# Patient Record
Sex: Female | Born: 1985 | Race: White | Hispanic: No | Marital: Married | Smoking: Never smoker
Health system: Southern US, Community
[De-identification: ages and names within clinical notes are randomized; demographics above are authoritative.]

## PROBLEM LIST (undated history)

## (undated) DIAGNOSIS — I2510 Atherosclerotic heart disease of native coronary artery without angina pectoris: Secondary | ICD-10-CM

## (undated) DIAGNOSIS — R51 Headache: Secondary | ICD-10-CM

## (undated) DIAGNOSIS — B977 Papillomavirus as the cause of diseases classified elsewhere: Secondary | ICD-10-CM

## (undated) DIAGNOSIS — T783XXA Angioneurotic edema, initial encounter: Secondary | ICD-10-CM

## (undated) DIAGNOSIS — R112 Nausea with vomiting, unspecified: Secondary | ICD-10-CM

## (undated) DIAGNOSIS — Z9889 Other specified postprocedural states: Secondary | ICD-10-CM

## (undated) DIAGNOSIS — R87629 Unspecified abnormal cytological findings in specimens from vagina: Secondary | ICD-10-CM

## (undated) DIAGNOSIS — L509 Urticaria, unspecified: Secondary | ICD-10-CM

## (undated) DIAGNOSIS — R519 Headache, unspecified: Secondary | ICD-10-CM

## (undated) DIAGNOSIS — I214 Non-ST elevation (NSTEMI) myocardial infarction: Secondary | ICD-10-CM

## (undated) HISTORY — PX: SHOULDER SURGERY: SHX246

## (undated) HISTORY — DX: Angioneurotic edema, initial encounter: T78.3XXA

## (undated) HISTORY — DX: Urticaria, unspecified: L50.9

## (undated) HISTORY — PX: OTHER SURGICAL HISTORY: SHX169

## (undated) HISTORY — PX: ADENOIDECTOMY: SUR15

## (undated) HISTORY — DX: Non-ST elevation (NSTEMI) myocardial infarction: I21.4

## (undated) HISTORY — DX: Atherosclerotic heart disease of native coronary artery without angina pectoris: I25.10

## (undated) HISTORY — PX: WISDOM TOOTH EXTRACTION: SHX21

## (undated) HISTORY — PX: TONSILLECTOMY: SUR1361

## (undated) HISTORY — PX: COLPOSCOPY: SHX161

---

## 2006-09-21 HISTORY — PX: SHOULDER SURGERY: SHX246

## 2007-03-03 ENCOUNTER — Ambulatory Visit (HOSPITAL_COMMUNITY): Admission: RE | Admit: 2007-03-03 | Discharge: 2007-03-03 | Payer: Self-pay | Admitting: Orthopaedic Surgery

## 2009-04-08 ENCOUNTER — Inpatient Hospital Stay (HOSPITAL_COMMUNITY): Admission: RE | Admit: 2009-04-08 | Discharge: 2009-04-12 | Payer: Self-pay | Admitting: Cardiology

## 2009-04-08 ENCOUNTER — Ambulatory Visit: Payer: Self-pay | Admitting: Cardiology

## 2009-04-09 ENCOUNTER — Encounter: Payer: Self-pay | Admitting: Cardiology

## 2009-04-10 ENCOUNTER — Ambulatory Visit: Payer: Self-pay | Admitting: Oncology

## 2009-04-10 ENCOUNTER — Encounter: Payer: Self-pay | Admitting: Cardiology

## 2009-04-11 ENCOUNTER — Ambulatory Visit: Payer: Self-pay | Admitting: Oncology

## 2009-04-16 ENCOUNTER — Encounter: Payer: Self-pay | Admitting: Cardiology

## 2009-04-16 DIAGNOSIS — I2510 Atherosclerotic heart disease of native coronary artery without angina pectoris: Secondary | ICD-10-CM | POA: Insufficient documentation

## 2009-04-19 ENCOUNTER — Ambulatory Visit: Payer: Self-pay | Admitting: Cardiology

## 2009-04-22 ENCOUNTER — Telehealth: Payer: Self-pay | Admitting: Cardiology

## 2009-04-25 ENCOUNTER — Ambulatory Visit: Payer: Self-pay

## 2009-04-25 ENCOUNTER — Ambulatory Visit: Payer: Self-pay | Admitting: Cardiology

## 2009-04-25 LAB — CONVERTED CEMR LAB
BUN: 18 mg/dL (ref 6–23)
CO2: 24 meq/L (ref 19–32)
Calcium: 9.9 mg/dL (ref 8.4–10.5)
Glucose, Bld: 96 mg/dL (ref 70–99)
Potassium: 5.2 meq/L — ABNORMAL HIGH (ref 3.5–5.1)
Sodium: 141 meq/L (ref 135–145)

## 2009-04-29 ENCOUNTER — Encounter: Payer: Self-pay | Admitting: Cardiology

## 2009-04-30 LAB — CONVERTED CEMR LAB
BUN: 14 mg/dL (ref 6–23)
CO2: 24 meq/L (ref 19–32)
Calcium: 9.3 mg/dL (ref 8.4–10.5)
Chloride: 107 meq/L (ref 96–112)
Creatinine, Ser: 0.74 mg/dL (ref 0.40–1.20)

## 2009-05-02 ENCOUNTER — Telehealth: Payer: Self-pay | Admitting: Cardiology

## 2009-05-09 ENCOUNTER — Encounter: Payer: Self-pay | Admitting: Cardiology

## 2009-05-09 ENCOUNTER — Telehealth: Payer: Self-pay | Admitting: Cardiology

## 2009-05-14 ENCOUNTER — Ambulatory Visit: Payer: Self-pay

## 2009-05-14 ENCOUNTER — Ambulatory Visit: Payer: Self-pay | Admitting: Oncology

## 2009-05-14 ENCOUNTER — Encounter: Payer: Self-pay | Admitting: Cardiology

## 2009-05-14 ENCOUNTER — Ambulatory Visit: Payer: Self-pay | Admitting: Cardiology

## 2009-05-14 DIAGNOSIS — E875 Hyperkalemia: Secondary | ICD-10-CM | POA: Insufficient documentation

## 2009-05-14 DIAGNOSIS — E782 Mixed hyperlipidemia: Secondary | ICD-10-CM | POA: Insufficient documentation

## 2009-05-16 ENCOUNTER — Encounter: Payer: Self-pay | Admitting: Cardiology

## 2009-05-16 ENCOUNTER — Other Ambulatory Visit: Admission: RE | Admit: 2009-05-16 | Discharge: 2009-05-16 | Payer: Self-pay | Admitting: Oncology

## 2009-05-16 LAB — CBC WITH DIFFERENTIAL/PLATELET
Basophils Absolute: 0 10*3/uL (ref 0.0–0.1)
Eosinophils Absolute: 0.1 10*3/uL (ref 0.0–0.5)
HCT: 34.2 % — ABNORMAL LOW (ref 34.8–46.6)
HGB: 11.5 g/dL — ABNORMAL LOW (ref 11.6–15.9)
LYMPH%: 37.4 % (ref 14.0–49.7)
MCV: 87.4 fL (ref 79.5–101.0)
MONO%: 10.1 % (ref 0.0–14.0)
NEUT#: 1.6 10*3/uL (ref 1.5–6.5)
NEUT%: 49.2 % (ref 38.4–76.8)
Platelets: 149 10*3/uL (ref 145–400)

## 2009-05-16 LAB — COMPREHENSIVE METABOLIC PANEL
ALT: 38 U/L — ABNORMAL HIGH (ref 0–35)
Albumin: 4.1 g/dL (ref 3.5–5.2)
CO2: 19 mEq/L (ref 19–32)
Calcium: 8.8 mg/dL (ref 8.4–10.5)
Chloride: 109 mEq/L (ref 96–112)
Creatinine, Ser: 0.57 mg/dL (ref 0.40–1.20)
Potassium: 3.7 mEq/L (ref 3.5–5.3)
Sodium: 141 mEq/L (ref 135–145)
Total Protein: 6.1 g/dL (ref 6.0–8.3)

## 2009-05-16 LAB — LIPID PANEL
Cholesterol: 69 mg/dL (ref 0–200)
LDL Cholesterol: 24 mg/dL (ref 0–99)
Triglycerides: 55 mg/dL (ref <150)

## 2009-05-21 LAB — LUPUS ANTICOAGULANT PANEL: DRVVT: 43.3 secs (ref 36.1–47.0)

## 2009-05-21 LAB — PROTEIN C, TOTAL: Protein C, Total: 78 % (ref 70–140)

## 2009-05-21 LAB — BETA-2 GLYCOPROTEIN ANTIBODIES
Beta-2 Glyco I IgG: <20 U/mL (ref <20)
Beta-2-Glycoprotein I IgM: <10 U/mL (ref <10)

## 2009-05-21 LAB — PROTEIN C ACTIVITY: Protein C Activity: 116 % (ref 75–133)

## 2009-05-21 LAB — PROTEIN S, TOTAL: Protein S Ag, Total: 72 % (ref 70–140)

## 2009-05-21 LAB — PROTEIN S, ANTIGEN, FREE: Protein S Ag, Free: 70 % normal (ref 50–147)

## 2009-05-22 ENCOUNTER — Encounter: Payer: Self-pay | Admitting: Cardiology

## 2009-05-22 ENCOUNTER — Encounter (HOSPITAL_COMMUNITY): Admission: RE | Admit: 2009-05-22 | Discharge: 2009-06-20 | Payer: Self-pay | Admitting: Cardiology

## 2009-06-20 ENCOUNTER — Encounter: Payer: Self-pay | Admitting: Cardiology

## 2009-06-21 ENCOUNTER — Encounter (HOSPITAL_COMMUNITY): Admission: RE | Admit: 2009-06-21 | Discharge: 2009-07-21 | Payer: Self-pay | Admitting: Orthopedic Surgery

## 2009-06-26 ENCOUNTER — Encounter (INDEPENDENT_AMBULATORY_CARE_PROVIDER_SITE_OTHER): Payer: Self-pay | Admitting: *Deleted

## 2009-06-26 ENCOUNTER — Telehealth: Payer: Self-pay | Admitting: Cardiology

## 2009-06-27 ENCOUNTER — Ambulatory Visit: Payer: Self-pay | Admitting: Cardiology

## 2009-06-28 ENCOUNTER — Encounter: Payer: Self-pay | Admitting: Cardiology

## 2009-07-03 LAB — CONVERTED CEMR LAB
ALT: 46 units/L — ABNORMAL HIGH (ref 0–35)
AST: 31 units/L (ref 0–37)
Alkaline Phosphatase: 57 units/L (ref 39–117)
Basophils Absolute: 0 10*3/uL (ref 0.0–0.1)
Basophils Relative: 0 % (ref 0–1)
Bilirubin, Direct: 0.1 mg/dL (ref 0.0–0.3)
Cholesterol: 82 mg/dL (ref 0–200)
Indirect Bilirubin: 0.4 mg/dL (ref 0.0–0.9)
MCHC: 30.6 g/dL (ref 30.0–36.0)
Neutro Abs: 2.3 10*3/uL (ref 1.7–7.7)
Neutrophils Relative %: 54 % (ref 43–77)
RDW: 14.6 % (ref 11.5–15.5)

## 2009-07-22 ENCOUNTER — Encounter (HOSPITAL_COMMUNITY): Admission: RE | Admit: 2009-07-22 | Discharge: 2009-08-21 | Payer: Self-pay | Admitting: Cardiology

## 2009-07-22 ENCOUNTER — Telehealth: Payer: Self-pay | Admitting: Cardiology

## 2009-08-14 ENCOUNTER — Encounter: Payer: Self-pay | Admitting: Cardiology

## 2009-08-20 LAB — CONVERTED CEMR LAB
Bilirubin, Direct: 0.1 mg/dL (ref 0.0–0.3)
Indirect Bilirubin: 0.4 mg/dL (ref 0.0–0.9)
LDL Cholesterol: 34 mg/dL (ref 0–99)
Total Protein: 6.7 g/dL (ref 6.0–8.3)
Triglycerides: 51 mg/dL (ref <150)
VLDL: 10 mg/dL (ref 0–40)

## 2009-08-21 ENCOUNTER — Encounter (HOSPITAL_COMMUNITY): Admission: RE | Admit: 2009-08-21 | Discharge: 2009-09-18 | Payer: Self-pay | Admitting: Orthopedic Surgery

## 2009-09-12 ENCOUNTER — Ambulatory Visit: Payer: Self-pay | Admitting: Oncology

## 2009-09-17 ENCOUNTER — Encounter: Payer: Self-pay | Admitting: Cardiology

## 2009-10-04 ENCOUNTER — Emergency Department (HOSPITAL_COMMUNITY): Admission: EM | Admit: 2009-10-04 | Discharge: 2009-10-04 | Payer: Self-pay | Admitting: Emergency Medicine

## 2009-10-07 ENCOUNTER — Ambulatory Visit: Payer: Self-pay | Admitting: Cardiology

## 2009-10-09 LAB — CONVERTED CEMR LAB
AST: 18 units/L (ref 0–37)
Albumin: 3.5 g/dL (ref 3.5–5.2)
Alkaline Phosphatase: 48 units/L (ref 39–117)
Cholesterol: 101 mg/dL (ref 0–200)
LDL Cholesterol: 54 mg/dL (ref 0–99)
Total Protein: 6.8 g/dL (ref 6.0–8.3)
Triglycerides: 83 mg/dL (ref 0.0–149.0)

## 2009-10-23 ENCOUNTER — Encounter: Payer: Self-pay | Admitting: Cardiology

## 2009-11-12 ENCOUNTER — Encounter: Payer: Self-pay | Admitting: Cardiology

## 2009-11-13 ENCOUNTER — Encounter: Payer: Self-pay | Admitting: Cardiology

## 2009-11-13 ENCOUNTER — Telehealth: Payer: Self-pay | Admitting: Cardiology

## 2009-11-14 ENCOUNTER — Ambulatory Visit: Payer: Self-pay | Admitting: Cardiology

## 2010-01-15 ENCOUNTER — Ambulatory Visit: Payer: Self-pay | Admitting: Cardiology

## 2010-01-31 ENCOUNTER — Encounter: Payer: Self-pay | Admitting: Cardiology

## 2010-03-17 ENCOUNTER — Ambulatory Visit: Payer: Self-pay | Admitting: Oncology

## 2010-03-28 ENCOUNTER — Encounter: Payer: Self-pay | Admitting: Cardiology

## 2010-08-13 ENCOUNTER — Ambulatory Visit: Payer: Self-pay | Admitting: Cardiology

## 2010-08-25 ENCOUNTER — Telehealth: Payer: Self-pay | Admitting: Cardiology

## 2010-08-25 LAB — CONVERTED CEMR LAB
Cholesterol: 104 mg/dL (ref 0–200)
LDL Cholesterol: 34 mg/dL (ref 0–99)
TSH: 2.875 microintl units/mL (ref 0.350–4.500)
VLDL: 11 mg/dL (ref 0–40)

## 2010-09-09 ENCOUNTER — Ambulatory Visit: Payer: Self-pay

## 2010-09-09 ENCOUNTER — Ambulatory Visit: Payer: Self-pay | Admitting: Cardiology

## 2010-10-12 ENCOUNTER — Encounter: Payer: Self-pay | Admitting: Orthopaedic Surgery

## 2010-10-22 NOTE — Assessment & Plan Note (Signed)
Summary: fluttering in chest   Visit Type:  Follow-up Primary Provider:  Wyvonnia Lora MD  CC:  heart flutter- chest discomfort for the past few days.  History of Present Illness: Noticed some skip beats, essentially when sitting down.  Worked out today and walked on treadmill without symptoms.  Has chronic shoulder issues since surgery.  Denies chest pain.  Denies syncope.  Repeat CRP pending.  Heading to [facility].    Current Medications (verified): 1)  Aspirin 81 Mg Tbec (Aspirin) .... Take One Tablet By Mouth Daily 2)  Nitroglycerin 0.4 Mg Subl (Nitroglycerin) .... One Tablet Under Tongue Every 5 Minutes As Needed For Chest Pain---May Repeat Times Three 3)  Simvastatin 20 Mg Tabs (Simvastatin) .... Take One-Fourth  Tablet By Mouth Daily At Bedtime  Allergies (verified): No Known Drug Allergies  Vital Signs:  Patient profile:   25 year old female Height:      66 inches Weight:      150.75 pounds BMI:     24.42 Pulse rate:   62 / minute Pulse rhythm:   regular Resp:     18 per minute BP sitting:   100 / 64  (left arm) Cuff size:   regular  Vitals Entered By: Vikki Ports (November 14, 2009 11:01 AM)  Physical Exam  General:  Well developed, well nourished, in no acute distress. Lungs:  Clear bilaterally to auscultation and percussion. Heart:  Non-displaced PMI, chest non-tender; regular rate and rhythm, S1, S2 without murmurs, rubs or gallops. Carotid upstroke normal, no bruit. Extremities:  No clubbing or cyanosis. Neurologic:  Alert and oriented x 3.   EKG  Procedure date:  11/14/2009  Findings:      NSR.  SB.  Impression & Recommendations:  Problem # 1:  CAD (ICD-414.00) Symptoms do not sound ischemic.  Neg coag workup.  Will continue to follow closely.   Her updated medication list for this problem includes:    Aspirin 81 Mg Tbec (Aspirin) .Marland Kitchen... Take one tablet by mouth daily    Nitroglycerin 0.4 Mg Subl (Nitroglycerin) ..... One tablet under tongue every  5 minutes as needed for chest pain---may repeat times three  Orders: EKG w/ Interpretation (93000)  Problem # 2:  HYPERCHOLESTEROLEMIA  IIA (ICD-272.0) At target.  Repeat CRP pending. Her updated medication list for this problem includes:    Simvastatin 20 Mg Tabs (Simvastatin) .Marland Kitchen... Take one-fourth  tablet by mouth daily at bedtime  Orders: EKG w/ Interpretation (93000)  Patient Instructions: 1)  Your physician recommends that you schedule a follow-up appointment in: 2 MONTHS 2)  Your physician recommends that you continue on your current medications as directed. Please refer to the Current Medication list given to you today.  Appended Document: fluttering in chest Palpitations likely secondary to simple PVCS.  See rhythm strip.

## 2010-10-22 NOTE — Letter (Signed)
[facility]   Imported By: Roderic Ovens 10/14/2009 10:48:05  _____________________________________________________________________  External Attachment:    Type:   Image     Comment:   External Document  Appended Document: [facility]  Reviewed in detail.  We will repeat her hsCRP level which was obtained during an infection process.  Then rediscuss.  TS

## 2010-10-22 NOTE — Assessment & Plan Note (Signed)
Summary: 2 month f/u ov   Visit Type:  Follow-up Primary Provider:  Wyvonnia Lora MD   History of Present Illness: Doing well.  SHe had fluttering, and none since that time.  Denies any chest pain, and feels good overall.  Denies further problems.  Went to [facility] without difficulty.     Current Medications (verified): 1)  Aspirin 81 Mg Tbec (Aspirin) .... Take One Tablet By Mouth Daily 2)  Nitroglycerin 0.4 Mg Subl (Nitroglycerin) .... One Tablet Under Tongue Every 5 Minutes As Needed For Chest Pain---May Repeat Times Three 3)  Simvastatin 20 Mg Tabs (Simvastatin) .... Take One-Fourth  Tablet By Mouth Daily At Bedtime  Allergies (verified): No Known Drug Allergies  Past History:  Past Medical History: Last updated: 04/16/2009 Current Problems:  CAD (ICD-414.00)- percutaneous coronary intervention. bare-metal stent placed in the mid  circumflex. PRIMARY HYPERCOAGULABLE STATE (ICD-289.81)  Vital Signs:  Patient profile:   25 year old female Height:      66 inches Weight:      155 pounds BMI:     25.11 Pulse rate:   71 / minute BP sitting:   114 / 68  (left arm) Cuff size:   regular  Vitals Entered By: Burnett Kanaris, CNA (January 15, 2010 10:19 AM)  Physical Exam  General:  Well developed, well nourished, in no acute distress. Head:  normocephalic and atraumatic Eyes:  PERRLA/EOM intact; conjunctiva and lids normal. Lungs:  Clear bilaterally to auscultation and percussion. Heart:  Non-displaced PMI, chest non-tender; regular rate and rhythm, S1, S2 without murmurs, rubs or gallops. Carotid upstroke normal, no bruit.  Extremities:  No clubbing or cyanosis.  No edema.   EKG  Procedure date:  01/15/2010  Findings:      NSR.  WNL.     Impression & Recommendations:  Problem # 1:  CAD (ICD-414.00) No new symptoms.  Doing well. Denies chest pain.   Her updated medication list for this problem includes:    Aspirin 81 Mg Tbec (Aspirin) .Marland Kitchen... Take one tablet by mouth  daily    Nitroglycerin 0.4 Mg Subl (Nitroglycerin) ..... One tablet under tongue every 5 minutes as needed for chest pain---may repeat times three  Orders: EKG w/ Interpretation (93000)  Problem # 2:  HYPERCHOLESTEROLEMIA  IIA (ICD-272.0) At  target on low dose therpay.  See trial of CRPs associated with infection.  Now back to 0.9.  Would favor continue medical treatment.   Her updated medication list for this problem includes:    Simvastatin 20 Mg Tabs (Simvastatin) .Marland Kitchen... Take one-fourth  tablet by mouth daily at bedtime  Patient Instructions: 1)  Your physician recommends that you continue on your current medications as directed. Please refer to the Current Medication list given to you today. 2)  Your physician wants you to follow-up in:   6 MONTHS. You will receive a reminder letter in the mail two months in advance. If you don't receive a letter, please call our office to schedule the follow-up appointment.

## 2010-10-22 NOTE — Assessment & Plan Note (Signed)
Summary: f47m   Visit Type:  Follow-up Primary Provider:  Wyvonnia Lora MD   History of Present Illness: Overall doing well. Back to work.  No current symptoms.  Recent lipid profile reviewed.  Denies any chest pain.  BP after exercise is 80systolic.   Current Medications (verified): 1)  Aspirin 81 Mg Tbec (Aspirin) .... Take One Tablet By Mouth Daily 2)  Nitroglycerin 0.4 Mg Subl (Nitroglycerin) .... One Tablet Under Tongue Every 5 Minutes As Needed For Chest Pain---May Repeat Times Three 3)  Metoprolol Succinate 25 Mg Xr24h-Tab (Metoprolol Succinate) .... Take 1/2 Tablet Daily 4)  Simvastatin 20 Mg Tabs (Simvastatin) .... Take One-Fourth  Tablet By Mouth Daily At Bedtime 5)  Azithromycin 250 Mg Tabs (Azithromycin) .... Once Daily  Allergies (verified): No Known Drug Allergies  Past History:  Past Medical History: Last updated: 04/16/2009 Current Problems:  CAD (ICD-414.00)- percutaneous coronary intervention. bare-metal stent placed in the mid  circumflex. PRIMARY HYPERCOAGULABLE STATE (ICD-289.81)  Vital Signs:  Patient profile:   25 year old female Height:      66 inches Weight:      154 pounds BMI:     24.95 Pulse rate:   69 / minute BP sitting:   90 / 54  (left arm)  Vitals Entered By: Laurance Flatten CMA (October 07, 2009 10:52 AM)  Physical Exam  General:  Well developed, well nourished, in no acute distress. Lungs:  Clear bilaterally to auscultation and percussion. Heart:  Non-displaced PMI, chest non-tender; regular rate and rhythm, S1, S2 without murmurs, rubs or gallops. Carotid upstroke normal, no bruit. Normal abdominal aortic size, no bruits. Femorals normal pulses, no bruits. Pedals normal pulses. No edema, no varicosities.   EKG  Procedure date:  10/07/2009  Findings:      NSR.  WNL.    Impression & Recommendations:  Problem # 1:  CAD (ICD-414.00) BP remains low.  Will d/c metoprolol.  Continue ASA. The following medications were removed from the  medication list:    Plavix 75 Mg Tabs (Clopidogrel bisulfate) .Marland Kitchen... Take one tablet by mouth daily    Metoprolol Succinate 25 Mg Xr24h-tab (Metoprolol succinate) .Marland Kitchen... Take 1/2 tablet daily Her updated medication list for this problem includes:    Aspirin 81 Mg Tbec (Aspirin) .Marland Kitchen... Take one tablet by mouth daily    Nitroglycerin 0.4 Mg Subl (Nitroglycerin) ..... One tablet under tongue every 5 minutes as needed for chest pain---may repeat times three  Orders: EKG w/ Interpretation (93000) T-Homocysteine (66440-34742) T-Lipoprotein (a) (59563-87564) TLB-Lipid Panel (80061-LIPID) TLB-Hepatic/Liver Function Pnl (80076-HEPATIC) TLB-CRP-High Sensitivity (C-Reactive Protein) (86140-FCRP)  Problem # 2:  HYPERCHOLESTEROLEMIA  IIA (ICD-272.0)  LDL 30mg /dl.  Now on low dose.  Will do hsCRP, homocysteine, and LPa. Her updated medication list for this problem includes:    Simvastatin 20 Mg Tabs (Simvastatin) .Marland Kitchen... Take one-fourth  tablet by mouth daily at bedtime  Orders: T-Homocysteine (33295-18841) T-Lipoprotein (a) (66063-01601) TLB-Lipid Panel (80061-LIPID) TLB-Hepatic/Liver Function Pnl (80076-HEPATIC) TLB-CRP-High Sensitivity (C-Reactive Protein) (86140-FCRP)  Patient Instructions: 1)  Your physician recommends that you schedule a follow-up appointment in: 4 MONTHS 2)  Your physician recommends that you return for a FASTING LIPID, LIVER, Homocysteine, CRP-HS and LPa   3)  Your physician has recommended you make the following change in your medication: STOP Metoprolol

## 2010-10-22 NOTE — Progress Notes (Signed)
Summary: re lab work  Phone Note Call from Patient   Caller: Patient [phone] Reason for Call: Talk to Nurse Summary of Call: re lab work Initial call taken by: Glynda Jaeger,  August 25, 2010 11:58 AM  Follow-up for Phone Call        Phone Call Completed PT AWARE OF LAB RESULTS. Follow-up by: Scherrie Bateman, LPN,  August 25, 2010 12:02 PM

## 2010-10-22 NOTE — Letter (Signed)
[facility]   Imported By: Roderic Ovens 04/15/2010 11:27:19  _____________________________________________________________________  External Attachment:    Type:   Image     Comment:   External Document

## 2010-10-22 NOTE — Progress Notes (Signed)
Summary: arm pit pain/chest flutter  Phone Note Call from Patient Call back at [phone]   Caller: Mom/Amy Reason for Call: Talk to Nurse Summary of Call: had pain in arm pit last night, fluttering in chest area Initial call taken by: Migdalia Dk,  November 13, 2009 8:03 AM  Follow-up for Phone Call        I spoke with the pt and last night she had pain in her left armpit and felt fluttering in her chest.  The pt denies chest pain and these symptoms are not similar to what she felt with her MI. The pt had a physician at [facility] listen to her heart and they said her heart sounded normal, BP 103/64.  The pt denies consuming caffiene. The pt feels fine today.  The pt is going to [facility] next week and would like to see Dr Riley Kill.  I arranged an appt for the pt to see Dr Riley Kill on 11/14/09.  Follow-up by: Julieta Gutting, RN, BSN,  November 13, 2009 8:26 AM

## 2010-10-22 NOTE — Letter (Signed)
[facility]   Imported By: Roderic Ovens 03/19/2010 10:17:09  _____________________________________________________________________  External Attachment:    Type:   Image     Comment:   External Document

## 2010-10-22 NOTE — Assessment & Plan Note (Signed)
Summary: f75m   Visit Type:  Follow-up Primary Provider:  Wyvonnia Lora MD   History of Present Illness: patient having no cardiology complaints today.  She has had some head congestion, but nothing major.  She is riding her horse alot at present.  Current Medications (verified): 1)  Aspirin 81 Mg Tbec (Aspirin) .... Take One Tablet By Mouth Daily 2)  Simvastatin 10 Mg Tabs (Simvastatin) .... Take 1/2 Tab Daily  Allergies (verified): No Known Drug Allergies  Comments:  Nurse/Medical Assistant: patient stated meds the only 2 she is on is asa 81 mg and simvastatin 5 mg  she had 20 mg of simvastatin and cutting them in to 1/4 now she would like 10 mg tabs so she can cut them in half  Vital Signs:  Patient profile:   25 year old female Weight:      155 pounds BMI:     25.11 Pulse rate:   64 / minute BP sitting:   114 / 80  (right arm)  Vitals Entered By: Dreama Saa, CNA (August 13, 2010 10:08 AM)  Physical Exam  General:  Well developed, well nourished, in no acute distress. Head:  normocephalic and atraumatic Eyes:  PERRLA/EOM intact; conjunctiva and lids normal. Lungs:  Clear bilaterally to auscultation and percussion. Heart:  PMI non displaced.  Normal S1 and S2.  No murmur, rub, or gallop.  Pulses:  pulses normal in all 4 extremities Extremities:  No clubbing or cyanosis. Neurologic:  Alert and oriented x 3.   EKG  Procedure date:  08/13/2010  Findings:      NSR.  Possible LAE.  Otherwise unremarkable tracing.   Impression & Recommendations:  Problem # 1:  CAD (ICD-414.00) Perfectly stable at present.  Given age, will do exercise GXT for prescription, and status of PCI.  Continue ASA at present.  The following medications were removed from the medication list:    Nitroglycerin 0.4 Mg Subl (Nitroglycerin) ..... One tablet under tongue every 5 minutes as needed for chest pain---may repeat times three Her updated medication list for this problem includes:   Aspirin 81 Mg Tbec (Aspirin) .Marland Kitchen... Take one tablet by mouth daily  Orders: Treadmill (Treadmill) T- * Misc. Laboratory test [phone])  Problem # 2:  HYPERCHOLESTEROLEMIA  IIA (ICD-272.0) Needs lipid and liver profile.  Will check.  On very low dose treatment.   Her updated medication list for this problem includes:    Simvastatin 10 Mg Tabs (Simvastatin) .Marland Kitchen... Take one tablet by mouth once daily  Orders: T- * Misc. Laboratory test [phone])  Patient Instructions: 1)  Your physician has recommended you make the following change in your medication: SIMVASTATIN 10MG  TAKE 1/2 TABLET DAILY 2)  Your physician has requested that you have an exercise tolerance test.  For further information please visit [email or web address]  Please also follow instruction sheet, as given. Prescriptions: SIMVASTATIN 10 MG TABS (SIMVASTATIN) take one tablet by mouth once daily  #90 x 4   Entered by:   Deliah Goody, RN   Authorized by:   Ronaldo Miyamoto, MD, [facility]   Signed by:   Deliah Goody, RN on 08/13/2010   Method used:   Print then Give to Patient   RxID:   [id number]

## 2010-10-22 NOTE — Progress Notes (Signed)
Summary: Fluttering in chest  Phone Note Call from Patient Call back at [phone]   Caller: Patient Reason for Call: Talk to Nurse Summary of Call: REQ TO SPEAK TO NURSE Initial call taken by: Migdalia Dk,  November 13, 2009 4:25 PM  Follow-up for Phone Call        I spoke with the pt and she is currently having the fluttering in her chest.  I called the [place] office and they will do an EKG and rhythm strip on this pt at this time.  They will fax results to our office for appt tomorrow. Follow-up by: Julieta Gutting, RN, BSN,  November 13, 2009 4:46 PM  Additional Follow-up for Phone Call Additional follow up Details #1::        EKG and strips are NSR.  Pt aware.  Pt will see Dr Riley Kill tomorrow. Julieta Gutting, RN, BSN  November 13, 2009 5:48 PM

## 2010-12-28 LAB — BASIC METABOLIC PANEL
BUN: 4 mg/dL — ABNORMAL LOW (ref 6–23)
BUN: 5 mg/dL — ABNORMAL LOW (ref 6–23)
Calcium: 8.6 mg/dL (ref 8.4–10.5)
Chloride: 110 mEq/L (ref 96–112)
Creatinine, Ser: 0.56 mg/dL (ref 0.4–1.2)
Creatinine, Ser: 0.64 mg/dL (ref 0.4–1.2)
GFR calc Af Amer: >60 mL/min (ref >60)
GFR calc non Af Amer: >60 mL/min (ref >60)
GFR calc non Af Amer: >60 mL/min (ref >60)
Glucose, Bld: 91 mg/dL (ref 70–99)
Sodium: 136 mEq/L (ref 135–145)

## 2010-12-28 LAB — CBC
HCT: 31.8 % — ABNORMAL LOW (ref 36.0–46.0)
Hemoglobin: 12.2 g/dL (ref 12.0–15.0)
Hemoglobin: 12.3 g/dL (ref 12.0–15.0)
MCHC: 34 g/dL (ref 30.0–36.0)
MCV: 88.7 fL (ref 78.0–100.0)
MCV: 88.7 fL (ref 78.0–100.0)
Platelets: 140 10*3/uL — ABNORMAL LOW (ref 150–400)
Platelets: 141 10*3/uL — ABNORMAL LOW (ref 150–400)
RBC: 4.05 MIL/uL (ref 3.87–5.11)
RBC: 4.08 MIL/uL (ref 3.87–5.11)
RDW: 14.4 % (ref 11.5–15.5)
WBC: 5.9 10*3/uL (ref 4.0–10.5)
WBC: 6.1 10*3/uL (ref 4.0–10.5)
WBC: 7.5 10*3/uL (ref 4.0–10.5)
WBC: 8.1 10*3/uL (ref 4.0–10.5)

## 2010-12-28 LAB — POCT CARDIAC MARKERS
CKMB, poc: 3.7 ng/mL (ref 1.0–8.0)
Myoglobin, poc: 342 ng/mL (ref 12–200)
Myoglobin, poc: >500 ng/mL (ref 12–200)

## 2010-12-28 LAB — ANTIPHOSPHOLIPID SYNDROME EVAL, BLD
Anticardiolipin IgA: <10 [APL'U] — ABNORMAL LOW (ref <13)
Anticardiolipin IgM: <10 [MPL'U] — ABNORMAL LOW (ref <10)
Antiphosphatidylserine IgG: <11 GPS U/mL (ref <11.0)
Antiphosphatidylserine IgM: <25 MPS U/mL (ref <25.0)
PTT Lupus Anticoagulant: 43.1 secs (ref 36.3–48.8)

## 2010-12-28 LAB — CK TOTAL AND CKMB (NOT AT ARMC)
CK, MB: 405.4 ng/mL — ABNORMAL HIGH (ref 0.3–4.0)
CK, MB: 94.9 ng/mL — ABNORMAL HIGH (ref 0.3–4.0)
Total CK: 2999 U/L — ABNORMAL HIGH (ref 7–177)
Total CK: 3252 U/L — ABNORMAL HIGH (ref 7–177)

## 2010-12-28 LAB — TROPONIN I
Troponin I: 3.64 ng/mL (ref 0.00–0.06)
Troponin I: 81.6 ng/mL (ref 0.00–0.06)

## 2010-12-28 LAB — DIFFERENTIAL
Lymphocytes Relative: 10 % — ABNORMAL LOW (ref 12–46)
Monocytes Absolute: 0.3 10*3/uL (ref 0.1–1.0)
Monocytes Relative: 4 % (ref 3–12)
Neutro Abs: 6.5 10*3/uL (ref 1.7–7.7)

## 2010-12-28 LAB — LIPID PANEL
HDL: 45 mg/dL (ref >39)
LDL Cholesterol: 58 mg/dL (ref 0–99)
Total CHOL/HDL Ratio: 2.6 RATIO
Triglycerides: 68 mg/dL (ref <150)
VLDL: 14 mg/dL (ref 0–40)

## 2010-12-28 LAB — BETA-2-GLYCOPROTEIN I ABS, IGG/M/A: Beta-2 Glyco I IgG: <20 U/mL (ref <20)

## 2010-12-28 LAB — PROTIME-INR: INR: 1 (ref 0.00–1.49)

## 2010-12-28 LAB — PLATELET COUNT: Platelets: 137 10*3/uL — ABNORMAL LOW (ref 150–400)

## 2011-02-03 NOTE — Discharge Summary (Signed)
NAME:  Erin Morris, HINDS NO.:  [id number]   MEDICAL RECORD NO.:  [id number]          PATIENT TYPE:  INP   LOCATION:  2007                         FACILITY:  [facility]   PHYSICIAN:  Arturo Morton. Riley Kill, MD, FACCDATE OF BIRTH:  [birth year]   DATE OF ADMISSION:  04/08/2009  DATE OF DISCHARGE:  04/12/2009                               DISCHARGE SUMMARY   PRIMARY CARDIOLOGIST:  Maisie Fus D. Riley Kill, MD, [facility]   HEMO/ONCOLOGY:  Blenda Nicely. Shadad.   DISCHARGE DIAGNOSES:  1. Coronary artery disease, status post percutaneous coronary      intervention.  The patient had a bare-metal stent placed in the mid      circumflex, which was the cause of her myocardial infarction.  She      had normal left main, left anterior descending, and right coronary      artery, 2-D echocardiogram, ejection fraction 50%.  2. Potential hypercoagulable state.  The patient being evaluated by      Hemo/Oncology.  Hemo/Onc okay with the patient being discharged      home to follow up outpatient, highly recommending discontinuation      of oral contraceptive.   PAST MEDICAL HISTORY:  1. Tonsillectomy.  2. Shoulder surgery.   This is a 25 year old Caucasian female, who ruled in as an non-STEMI and  underwent urgent cardiac catheterization.  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  On day of admission, she was at the hospital  when she began to have acute symptoms of chest pain, shoulder pain,  nausea, and vomiting.  Her EKG showed nonspecific findings.  However,  her troponin initially was 3.64 and subsequently peaked to 81.6.  The  patient underwent cardiac catheterization on April 08, 2009, which showed  to have a total mid circumflex occlusion with normal EF and normal other  coronaries.  She received angioplasty and stent placement with a Liberte  bare-metal stent to recovers successfully without any complication, and  is currently tolerating aspirin and Plavix without problems.  The  patient denied any history of tobacco abuse or illicit substance use.  She is on oral contraceptive.   FAMILY HISTORY:  Viewed and noted to have a history of pulmonary emboli  in grandfather and the patient's father's twin brother had a DVT.   The patient will be worked up further as outpatient with Hemo/Oncology.  Post-cath intervention, cardiac rehabilitation was initiated.  The  patient has done quite well, tolerating very low dose beta-blocker  because of hypotension.  On day of discharge, Dr. Riley Kill in to see the  patient.  Blood pressure 99/53, heart rate is 91, and sat 96% on room  air.  Hemoglobin stable at 12.3.  Coag workup, results pending.  The  patient is scheduled to follow up with Dr. Riley Kill, July 30 at 3:45.  Dr. Alver Fisher office will call the patient with date and time.  The  patient has been given extensive instructions and written instructions  regarding her cardiac catheterization and myocardial infarction.  She is  limited to no driving for 2 weeks.  No sexual activity for  1 week.  She  may shower, no lifting for 1 week.  I will not clear her to return to  work for 3 weeks.  Heart-healthy diet is recommended.   Her prescriptions include:  1. Aspirin 325 daily.  2. Plavix 75 daily.  3. Pepcid 40 mg by mouth at bedtime.  4. Lisinopril 2.5 mg daily.  5. Metoprolol tartrate suspension should come in at 2.5 mg/1 mL      solution.  She is going to take 2.5 mL every 12 hours.  6. Nitroglycerin sublingual p.r.n. as needed.   DURATION OF DISCHARGE/ENCOUNTER:  Well over 30 minutes.      Dorian Pod, ACNP      Arturo Morton. Riley Kill, MD, [facility]  Electronically Signed    MB/MEDQ  D:  04/12/2009  T:  04/13/2009  Job:  [id number]   cc:   Blenda Nicely. [employer]

## 2011-02-03 NOTE — Consult Note (Signed)
NAME:  Erin Morris, Erin Morris NO.:  [id number]   MEDICAL RECORD NO.:  [id number]          PATIENT TYPE:  INP   LOCATION:  2007                         FACILITY:  [facility]   PHYSICIAN:  Blenda Nicely. Shadad        DATE OF BIRTH:  [birth year]   DATE OF CONSULTATION:  04/10/2009  DATE OF DISCHARGE:                                 CONSULTATION   REQUESTING PHYSICIAN:  Erin Morton. Riley Kill, MD, [facility]   REASON FOR CONSULTATION:  Acute myocardial infarction in the setting of  a potential hypercoagulable state.   HISTORY OF PRESENT ILLNESS:  A pleasant 25 year old Caucasian female, a  native of [place], but currently resides in [place], [place].  She currently lives independently with her boyfriend and currently works  as a Engineer, structural at [employer].  She does not have any  really significant comorbid conditions or previous hospitalizations  other than have had a history of shoulder dislocation and ligamentous  laxity and has required surgical interventions previously in 2008.  The  patient had been well until a few days ago on April 08, 2009, where she  had presented with acute symptoms of a chest pain, shoulder pain, and  nausea and vomiting and presented to [facility] Emergency Department.  At that time, her workup revealed a nonspecific finding on her EKG and  subsequently serial cardiac enzymes revealed elevation in troponin  initially at 3.64 and subsequently peaked at 81.6.  Given these  findings, I felt that she has a non-ST elevation acute myocardial  infarction and underwent a catheterization on April 08, 2009, and showed  to have a total mid circumflex occlusion and with normal EF and normal  other coronaries include the right coronary artery, LAD, and LMCA.  The  patient received angioplasty and stent placement and recovered  successfully without any complications and currently receiving both  aspirin and Plavix at this time.  Clinically, again as  mentioned,  feeling well without any residual chest pain or shortness of breath.  She has not reported any preceding symptoms, had not reported any  shortness of breath preceding that, did not report any recent  hospitalization or surgery  nor does she report any recent trauma or leg  swelling at any time.   REVIEW OF SYSTEMS:  Did not report any headaches, blurred vision, or  double vision.  She does not report any motor or sensory neuropathy, not  report any alteration of mental status.  Does not report any psychiatric  issues or depression.  Did not report any fevers, chills, or night  sweats.  Did not report any weight loss or appetite changes.  Did not  report any chest pain, orthopnea, or PND.  Did not report any cough,  hemoptysis, or hematemesis.  No nausea, vomiting, or abdominal pain.  No  hematochezia, melena, or genitourinary complaints.  Rest of review of  systems is unremarkable.   PAST MEDICAL HISTORY:  As mentioned, she has a history of joint laxity  in her shoulder, but did not really have any history of any joint  problems.  Did not report any recurrent bone fractures.  Did not report  any hypertension or diabetes.  Did not report any drug use or smoking.  Did report to use of oral contraceptives at the low-dose estrogen.   SOCIAL HISTORY:  She is single.  She lives with her boyfriend.  No  pregnancy.  She works as a Engineer, structural.  Again, denied use of cocaine  or any other drugs at this time.   FAMILY HISTORY:  Her father is alive at age 66 and no health issues, his  father had an MI and his father's mother had an aneurysm, also diagnosed  with a pulmonary embolus and found to have factor V Leiden.  Her uncle  which is her father's twin brother had a DVT and found to have factor V  Leiden.  Her mother is healthy and has a history of hypothyroid.  Her  aunt had multiple miscarriages.  She has 2 cousins who had had multiple  miscarriages as well.  She had 1 sister  and is healthy.   ALLERGIES:  None.   MEDICATIONS:  Aspirin, Plavix, Lovenox, Pepcid, lisinopril, and  metoprolol.   PHYSICAL EXAMINATION:  GENERAL:  Alert white female, not in any  distress.  VITAL SIGNS:  Within normal range with a blood pressure of 99/59, pulse  of 76, and respirations 18.  She was afebrile.  HEENT:  Head is normocephalic and atraumatic.  Pupils are equal, round,  and reactive to light.  Oral mucosa is moist and pink.  NECK:  Supple.  No lymphadenopathy.  HEART:  Regular rate and rhythm.  S1 and S2.  LUNGS:  Clear to auscultation.  ABDOMEN:  Soft and nontender.  No hepatosplenomegaly.  EXTREMITIES:  No edema.   LABORATORY DATA:  Homocysteine of 5.1.  Hemoglobin 11.4 and white cell  count 6.1.  Her lipid profile showed cholesterol of 117, LDL of 58, HDL  45.  Troponin peak at 81.   ASSESSMENT AND PLAN:  A 25 year old female presented with non-ST-  elevation acute myocardial infarction, found to have single-vessel  disease, status post percutaneous transluminal coronary angioplasty and  stent placement.  I had a lengthy discussion today with [name] and  her family discussing the differential diagnosis for acute MI in a young  individual.  1. Hypercoagulable state.  I think antiphospholipid antibody would be      the most common one given the fact that she had normal homocystine.      Again that is the condition that affects the arterial structure.      She does have a family history of early miscarriages and that may      be certainly something that worth investigation.  Factor V Leiden      mutation although runs in the family, it is usually venous in      etiology unless we are dealing with a paradoxical embolus and again      in the setting of oral contraceptives and positive factor V Leiden      and bubble study may be indicated to rule out a patent foramen      ovale.  2. Drug abuse in form of cocaine and spasm.  Again, it is less likely      given  the lack of history.  3. Structural damage within the arterials that has not been documented      in the catheterization.  Again, the condition like osteogenesis  imperfecta or Marfan syndrome may be possibility given her joint      laxity problem and makes it a also worth consideration.   RECOMMENDATION:  Recommendation at this point will be as follows:  1. I agree with a hypercoagulable workup.  I will add the      antiphospholipid panel and PTT.  2. I agree with stopping oral contraceptives.  3. I agree with aspirin and Plavix for the time being.  Although, she      has positive antiphospholipid antibody      syndrome and may be consideration of Coumadin.  4. If she is medically stable, discharge from the hospital.  It is      reasonable from my standpoint with a followup at the [facility].     ______________________________  Blenda Nicely. [facility]  Electronically Signed     Blenda Nicely. [facility]  Electronically Signed    FNS/MEDQ  D:  04/10/2009  T:  04/11/2009  Job:  [id number]

## 2011-02-03 NOTE — Cardiovascular Report (Signed)
NAME:  Erin Morris, Erin Morris NO.:  [id number]   MEDICAL RECORD NO.:  [id number]          PATIENT TYPE:  INP   LOCATION:  2007                         FACILITY:  [facility]   PHYSICIAN:  Arturo Morton. 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  She had chest discomfort, and  presented to the emergency room.  The electrocardiogram was nonspecific  and enzymes were borderline.  Repeat enzymes approximately 2 hours later  were significantly elevated, and then a repeat set was obtained shortly  thereafter.  They were abnormal as well.  The electrocardiogram at that  point was within normal limits.  Dr. Margretta Ditty spoke with Dr. Willa Rough, and she was transferred emergently to [facility] for  probable cath and possible intervention.  The patient arrived in the  holding area of the Cath Lab.  I reviewed the findings with the patient  and her mother in detail.  We spoke about specific mechanisms  potentially possible in the setting of myocardial infarction in a 2-  year-old, and this was reviewed in extensive detail.  Preparations were  made for urgent catheterization evaluation.  Risks, benefits, and  alternatives were discussed and the patient was agreeable to proceed.   PROCEDURE:  1. Left heart catheterization  2. Selective coronary arteriography.  3. Selective left ventriculography.  4. Aspiration thrombectomy.  5. Percutaneous stenting of the circumflex coronary artery.   DESCRIPTION OF PROCEDURE:  The patient was brought to the  Catheterization Laboratory and prepped and draped in the usual fashion.  Informed consent was obtained.  She was prepped and draped in the usual  fashion.  Through an anterior puncture, the femoral artery was easily  entered.  Following this, views of the left coronary artery and right  coronary artery were obtained.  Contrast hang-up was noted in the  proximal circumflex vessel.  As a result, we elected to perform  percutaneous coronary intervention.  Of note, the patient had done a  pregnancy test herself earlier in the day to document this prior to  coming to the emergency room, and she said it was negative.  She has  been on oral contraceptives for control of bleeding.  ACT was checked.  We elected to give the patient unfractionated heparin, and eptifibatide  in large because of the anticipation that this could represent a large  thrombus load.  The lesion was easily crossed with a [place] wire, and  then we were able to see a very large circumflex marginal and then a  somewhat smaller AV circumflex.  Given these findings, we elected to  perform aspiration thrombectomy with the assumption that this  represented a large thrombus load.  Two passes were performed up and  down the area of total occlusion.  Interestingly enough, there was not  much change in the overall appearance and therefore the artery was then  quickly dilated with a balloon and  this resulted in some re-  establishment of flow.  We then upgraded the balloon and repeat  dilatations were performed.  There was not a lot of improvement despite  multiple balloon dilatations.  As a result, I elected to stent the very  large marginal branch.  Given the patient's age, and issues with regard  to potential need for long-term antiplatelet therapy, we elected to use  a non-drug-eluting platform.  The artery was then stented using a Boston  scientific 2.75 x 24 very flex stent.  The stent was initially deployed  at 10 atmospheres and the patient was given 600 mg of oral clopidogrel.  We followed with post-dilatations using a [employer] scientific [place] Quantum  apex balloon, 3.0 x 12.  The stent was postdilated with 14 atmospheres,  resulting in a  dramatic improvement in the appearance of the artery.  The smaller AV branch was not visible at the completion of the  procedure, and I then attempted to pass a wire through the stent down in  the AV groove, but several attempts at this did not result in  transition.  The patient was symptom free at this point and there was an  excellent angiographic appearance of the circumflex marginal and we  elected to complete the procedure at this point.  Wires and balloons  were removed.  Central aortic and left ventricular pressures were  measured with a pigtail catheter.  Ventriculography was performed in the  RAO projection.  Subsequent to this, the patient was taken to the  holding area after suturing in the femoral sheath.  There were no major  complications.  I then reviewed in detail with her family the  angiographic findings, potential pathophysiology, and future evaluation  and workup.   HEMODYNAMIC DATA:  1. The central aortic pressure was 105/77, mean 91.  2. Left ventricular pressure 101/21.  3. There was no gradient or pullback across the aortic valve.   ANGIOGRAPHIC DATA:  1. Ventriculography was performed in the RAO projection.  Overall      ejection fraction would be estimated in the range of about 50-55%.      There was a moderate wall motion abnormality in the mid basilar      portions of the inferior wall, consistent with the patient's      circumflex infarction, the most basal portions supplied by the      right coronary territory did move.  2. The left main coronary artery is free of critical disease.  3. The LAD courses to the apex.  The LAD appears smooth throughout.      It provides a large diagonal and several smaller diagonal branches.      No significant focal obstruction is noted.  4. The circumflex coronary artery is basically occluded after 2 small      marginal branches.  There is hang-up of contrast at the site.      Multiple passes with the aspiration  thrombectomy catheter did not      dramatically change the appearance that was best changed by      stenting of the vessel.  There was a smaller AV groove branch that      supplied a very small amount of myocardial territory.  This was      occluded in the area covered by the stent.  The marginal branch      itself was extraordinarily large and following stenting there was a      normal appearance to  the vessel without evidence of distal      embolization and the distal vessel trifurcated.  5. The right coronary artery appears smooth throughout and provides a      posterior descending branch in the small posterolateral system.   CONCLUSIONS:  1. Acute myocardial infarction with successful percutaneous stenting      using a non-drug-eluting platform.  2. History of oral contraceptive use.   DISPOSITION:  The patient presented with an acute myocardial infarction.  Of interest, there did not appear to be a large thrombotic load.  In  addition, there is very little evidence to suggest spontaneous coronary  dissection.  The appearance of the vessel following stenting was  excellent suggesting the likelihood of a ruptured plaque.  We plan a  hematologic evaluation to look for sources of clotting disorder.  The  patient's uncle apparently has factor V Leiden.  In addition, the  patient has been using oral contraceptives.  We will recommend  discontinuation of their use.  She will require dual antiplatelet  therapy for a minimum of 1 month, and preferably for a year and then  aspirin long term thereafter.  Lipid workup will also be obtained.      Arturo Morton. Riley Kill, MD, [facility]  Electronically Signed    TDS/MEDQ  D:  04/13/2009  T:  04/13/2009  Job:  [id number]   cc:   Rhae Lerner. Margretta Ditty, M.D.

## 2011-02-03 NOTE — H&P (Signed)
NAME:  Erin Morris, Erin Morris NO.:  [id number]   MEDICAL RECORD NO.:  [id number]          PATIENT TYPE:  INP   LOCATION:  2007                         FACILITY:  [facility]   PHYSICIAN:  Arturo Morton. Riley Kill, MD, FACCDATE OF BIRTH:  07-Oct-1985   DATE OF ADMISSION:  04/08/2009  DATE OF DISCHARGE:  04/12/2009                              HISTORY & PHYSICAL   CHIEF COMPLAINT:  Chest pain.   HISTORY OF PRESENT ILLNESS:  [name] is a 25 year old female with no  prior medical history who developed chest pain approximately 24 hours  ago while eating an icy according to her boyfriend.  She had recurrence  and increased in pain this morning and went to the [facility] Emergency  Room.  There the initial set of enzymes were borderline and the EKG was  nonspecific.  Repeat EKG was within normal limits, but enzymes were  elevated and a quick repeated of those enzymes revealed continued rise  in the enzymes.  The patient is on oral contraceptives, but has no other  risk factors for acute coronary syndrome.  She is a nonsmoker.  She  works as a Designer, industrial/product at [employer].  She was brought down to the  holding area of the catheterization laboratory for prompt evaluation.  She continues to have some ongoing chest discomfort.  She was evaluated  by Dr. Margretta Ditty.  He spoke with Dr. Myrtis Ser to arrange the transfer.  Pain  continues to be about 3/10.  The patient does say that she took a  pregnancy test earlier this morning to confirm that in fact she was not  pregnant and it was negative.  She has been using oral contraceptives  for regulation of her periods.   Past medical history is remarkable for tonsillectomy and shoulder  surgery.   She takes no medications.   FAMILY HISTORY:  Her father is 72 and in good health.  Her mother is 82  and takes thyroid pill.  The father's twin brother has a clotting  disorder described as probably a factor V.   SOCIAL HISTORY:  She is a nonsmoker.  She  works at [employer].  She has  a boyfriend.   REVIEW OF SYSTEMS:  She works the 4-10 shift at [employer].  She is tired a lot.   PHYSICAL EXAMINATION:  GENERAL:  A young female who is alert and  oriented and in no acute distress.  She does complain of 3/10 chest  pain.  VITAL SIGNS:  Blood pressure is 105/77, pulse 78, respiratory rate 16,  the temperature was not recorded.  NECK:  There was no obvious jugular venous distention.  LUNGS:  The lung fields are clear to auscultation and percussion.  HEART:  The PMI is nondisplaced.  There was no obvious rubs, murmurs, or  gallops.  ABDOMEN:  Soft without hepatosplenomegaly.  EXTREMITIES:  Pulses are intact.   Her last electrocardiogram demonstrates nonspecific ST-T abnormality and  was eventually read as within normal limits.   Laboratory data is as recorded in E-chart.  It is consistent with  myocardial injury.  IMPRESSION:  1. Acute coronary syndrome with non-ST elevation myocardial      infarction.  2. Oral contraceptive use.  3. Prior tonsillectomy and adenoidectomy.  4. Prior shoulder surgery.   PLAN:  I met with the patient and her mother.  I have explained to them  the possibilities given her young age, which would include spontaneous  coronary dissection, some hypercoagulable disorder and/or standard  ruptured plaque.  Urgent catheterization has been recommended.  Risks,  benefits, and alternatives have been discussed and she would be taken to  the laboratory for acute prompt evaluation.      Arturo Morton. Riley Kill, MD, [facility]  Electronically Signed     TDS/MEDQ  D:  04/12/2009  T:  04/13/2009  Job:  [id number]

## 2011-02-20 ENCOUNTER — Encounter: Payer: Self-pay | Admitting: Cardiology

## 2011-03-11 ENCOUNTER — Ambulatory Visit: Payer: Self-pay | Admitting: Cardiology

## 2011-04-20 ENCOUNTER — Ambulatory Visit: Payer: Self-pay | Admitting: Cardiology

## 2011-04-20 ENCOUNTER — Ambulatory Visit (INDEPENDENT_AMBULATORY_CARE_PROVIDER_SITE_OTHER): Payer: Self-pay | Admitting: Cardiology

## 2011-04-20 ENCOUNTER — Encounter: Payer: Self-pay | Admitting: Cardiology

## 2011-04-20 VITALS — BP 95/62 | HR 56 | Ht 67.0 in | Wt 160.0 lb

## 2011-04-20 DIAGNOSIS — I2510 Atherosclerotic heart disease of native coronary artery without angina pectoris: Secondary | ICD-10-CM

## 2011-04-20 DIAGNOSIS — E7800 Pure hypercholesterolemia, unspecified: Secondary | ICD-10-CM

## 2011-04-20 NOTE — Patient Instructions (Signed)
Your physician recommends that you schedule a follow-up appointment in: 6 months  

## 2011-05-02 NOTE — Assessment & Plan Note (Signed)
Last LD 34 on low dose therapy  (Smva 5)

## 2011-05-02 NOTE — Progress Notes (Signed)
HPI:  Erin Morris comes in for follow up.  She is upbeat as usual.   She has no major complaints.  Feels good.  Denies any chest pain.  Remains on fairly simple therapy at this point in time.  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    Current Outpatient Prescriptions  Medication Sig Dispense Refill  . aspirin 81 MG EC tablet Take 81 mg by mouth daily.        . simvastatin (ZOCOR) 10 MG tablet Take 1/2 tab daily        No Known Allergies  Past Medical History  Diagnosis Date  . CAD (coronary artery disease)     Percutaneous coronary intervention. Bare metal stent placed in the mid circumflex  . Primary hypercoagulable state     Past Surgical History  Procedure Date  . Tonsillectomy   . Shoulder surgery   . Percutaneous coronary intervention 2010    Bare-metal stent placed in teh mid circumflex    No family history on file.  History   Social History  . Marital Status: Single    Spouse Name: N/A    Number of Children: N/A  . Years of Education: N/A   Occupational History  . Not on file.   Social History Main Topics  . Smoking status: Never Smoker   . Smokeless tobacco: Not on file  . Alcohol Use: Not on file  . Drug Use: No  . Sexually Active: Not on file   Other Topics Concern  . Not on file   Social History Narrative   SingleLives with her boyfriendNo pregnancy    ROS: Please see the HPI.  All other systems reviewed and negative.  PHYSICAL EXAM:  BP 95/62  Pulse 56  Ht 5\' 7"  (1.702 m)  Wt 160 lb (72.576 kg)  BMI 25.06 kg/m2  General: Well developed, well nourished, in no acute distress. Head:  Normocephalic and atraumatic. Neck: no JVD Lungs: Clear to auscultation and percussion. Heart: Normal S1 and S2.  No murmur, rubs or gallops.  Pulses: Pulses normal in all 4 extremities. Extremities: No clubbing or cyanosis. No edema. Neurologic: Alert and oriented x 3.  EKG:  SB.  NSR. WNL.  No acute changes.      ASSESSMENT AND PLAN:

## 2011-05-02 NOTE — Assessment & Plan Note (Signed)
Continues to get along well.  No major issues at the present time.  We will continue to follow the patient closely at this point in time.

## 2011-05-21 ENCOUNTER — Telehealth: Payer: Self-pay | Admitting: Cardiology

## 2011-05-21 NOTE — Telephone Encounter (Signed)
Pt has appt with ob/gyn . Need to discuss icd.

## 2011-05-21 NOTE — Telephone Encounter (Signed)
I'm not sure if there are any risk involved with having an IUD placed with a history of CAD. [name] has an appointment with her OBGYN next Thursday to discuss placement of a ParaGard (Copper IUD without hormones). Since this IUD lacks hormones, this should be ok but I will have Dr. Riley Kill review & let her know.

## 2011-05-27 NOTE — Telephone Encounter (Signed)
Per Dr. Riley Kill this should be fine. Called pt to give her this information. Left message to call back

## 2011-05-27 NOTE — Telephone Encounter (Signed)
Pt rtn call re previous msg re IUD, pls call [phone]

## 2011-05-27 NOTE — Telephone Encounter (Signed)
Spoke with pt and told her I would have Dr. Riley Kill review today and we would call her with his recommendations. Pt's appt with OBGYN is tomorrow at 3:00

## 2011-05-28 NOTE — Telephone Encounter (Signed)
I left a message on the patient's identified voice mail that the IUD placement should be fine per Dr. Riley Kill.

## 2011-10-26 ENCOUNTER — Telehealth: Payer: Self-pay | Admitting: Cardiology

## 2011-10-26 NOTE — Telephone Encounter (Signed)
10/26/11--called pt and advised, OK to take melatonin--pt agrees--nt

## 2011-10-26 NOTE — Telephone Encounter (Signed)
Pt calling re melitonin 5 mg is this ok to take, per her pcp to help her sleep? pt on her way to work ok to leave message

## 2011-11-09 ENCOUNTER — Ambulatory Visit (INDEPENDENT_AMBULATORY_CARE_PROVIDER_SITE_OTHER): Payer: 59 | Admitting: Cardiology

## 2011-11-09 ENCOUNTER — Encounter: Payer: Self-pay | Admitting: Cardiology

## 2011-11-09 DIAGNOSIS — E7800 Pure hypercholesterolemia, unspecified: Secondary | ICD-10-CM

## 2011-11-09 DIAGNOSIS — I2510 Atherosclerotic heart disease of native coronary artery without angina pectoris: Secondary | ICD-10-CM

## 2011-11-09 LAB — HEPATIC FUNCTION PANEL
ALT: 17 U/L (ref 0–35)
AST: 19 U/L (ref 0–37)
Alkaline Phosphatase: 41 U/L (ref 39–117)
Bilirubin, Direct: 0.1 mg/dL (ref 0.0–0.3)
Total Bilirubin: 0.4 mg/dL (ref 0.3–1.2)

## 2011-11-09 MED ORDER — SIMVASTATIN 10 MG PO TABS: ORAL_TABLET | ORAL | Status: DC

## 2011-11-09 NOTE — Patient Instructions (Signed)
Your physician wants you to follow-up in: 6 MONTHS.  You will receive a reminder letter in the mail two months in advance. If you don't receive a letter, please call our office to schedule the follow-up appointment.  Your physician recommends that you continue on your current medications as directed. Please refer to the Current Medication list given to you today.  Your physician recommends that you have a FASTING LIPID and LIVER profile today.

## 2011-11-09 NOTE — Progress Notes (Signed)
   HPI:  She is doing really well.  She had some brief left shoulder pain, similar to what she had for her surgery to her shoulder, not like her MI.  Brief, sudden, not with SOB--not like MI.  Tolerating meds.  No other limitations.    Current Outpatient Prescriptions  Medication Sig Dispense Refill  . aspirin 81 MG EC tablet Take 81 mg by mouth daily.        . Melatonin 5 MG CAPS Take 1 capsule by mouth daily.      . simvastatin (ZOCOR) 10 MG tablet Take 1/2 tab daily        No Known Allergies  Past Medical History  Diagnosis Date  . CAD (coronary artery disease)     Percutaneous coronary intervention. Bare metal stent placed in the mid circumflex  . Primary hypercoagulable state     Past Surgical History  Procedure Date  . Tonsillectomy   . Shoulder surgery   . Percutaneous coronary intervention 2010    Bare-metal stent placed in teh mid circumflex    No family history on file.  History   Social History  . Marital Status: Single    Spouse Name: N/A    Number of Children: N/A  . Years of Education: N/A   Occupational History  . Not on file.   Social History Main Topics  . Smoking status: Never Smoker   . Smokeless tobacco: Not on file  . Alcohol Use: Not on file  . Drug Use: No  . Sexually Active: Not on file   Other Topics Concern  . Not on file   Social History Narrative   SingleLives with her boyfriendNo pregnancy    ROS: Please see the HPI.  All other systems reviewed and negative.  PHYSICAL EXAM:  BP 110/64  Pulse 67  Ht 5\' 7"  (1.702 m)  Wt 165 lb (74.844 kg)  BMI 25.84 kg/m2  General: Well developed, well nourished, in no acute distress. Head:  Normocephalic and atraumatic. Neck: no JVD Lungs: Clear to auscultation and percussion. Heart: Normal S1 and S2.  No murmur, rubs or gallops.  Pulses: Pulses normal in all 4 extremities. Extremities: No clubbing or cyanosis. No edema. Neurologic: Alert and oriented x 3.  EKG: NSR  WNL.,     ASSESSMENT AND PLAN:

## 2011-11-13 NOTE — Assessment & Plan Note (Signed)
Repeat liver and lipid profiles.

## 2011-11-13 NOTE — Assessment & Plan Note (Signed)
The current feeling is nothing which she had previously and feels like related to shoulder.  No other associated symptoms.  Continue current treatment.

## 2012-01-26 ENCOUNTER — Other Ambulatory Visit: Payer: Self-pay | Admitting: *Deleted

## 2012-01-26 MED ORDER — NITROGLYCERIN 0.4 MG SL SUBL: 0.4000 mg | SUBLINGUAL_TABLET | SUBLINGUAL | Status: DC | PRN

## 2012-05-20 ENCOUNTER — Ambulatory Visit: Payer: 59 | Admitting: Cardiology

## 2012-05-30 ENCOUNTER — Encounter: Payer: Self-pay | Admitting: Cardiology

## 2012-05-30 ENCOUNTER — Ambulatory Visit (INDEPENDENT_AMBULATORY_CARE_PROVIDER_SITE_OTHER): Payer: 59 | Admitting: Cardiology

## 2012-05-30 ENCOUNTER — Other Ambulatory Visit: Payer: Self-pay | Admitting: *Deleted

## 2012-05-30 VITALS — BP 110/76 | HR 53 | Ht 67.0 in | Wt 170.0 lb

## 2012-05-30 DIAGNOSIS — E7800 Pure hypercholesterolemia, unspecified: Secondary | ICD-10-CM

## 2012-05-30 DIAGNOSIS — I2510 Atherosclerotic heart disease of native coronary artery without angina pectoris: Secondary | ICD-10-CM

## 2012-05-30 MED ORDER — SIMVASTATIN 5 MG PO TABS: 5.0000 mg | ORAL_TABLET | Freq: Every day | ORAL | Status: DC

## 2012-05-30 NOTE — Patient Instructions (Addendum)
Your physician recommends that you continue on your current medications as directed. Please refer to the Current Medication list given to you today.  Your physician wants you to follow-up in: Dr. Fredrik Cove will receive a reminder letter in the mail two months in advance. If you don't receive a letter, please call our office to schedule the follow-up appointment.

## 2012-05-30 NOTE — Assessment & Plan Note (Signed)
At target earlier in year.  Recheck in follow up.

## 2012-05-30 NOTE — Progress Notes (Signed)
   HPI:  Patient is in for followup. Clinically she is doing extremely well. She has no limitations. She does note that she doesn't tolerate heat quite as well. She denies however progressive chest pain.  Current Outpatient Prescriptions  Medication Sig Dispense Refill  . aspirin 81 MG EC tablet Take 81 mg by mouth daily.        . Melatonin 5 MG CAPS Take 1 capsule by mouth daily.      . nitroGLYCERIN (NITROSTAT) 0.4 MG SL tablet Place 1 tablet (0.4 mg total) under the tongue every 5 (five) minutes as needed for chest pain.  25 tablet  4  . simvastatin (ZOCOR) 5 MG tablet Take 1 tablet (5 mg total) by mouth at bedtime.  90 tablet  3    No Known Allergies  Past Medical History  Diagnosis Date  . CAD (coronary artery disease)     Percutaneous coronary intervention. Bare metal stent placed in the mid circumflex  . Primary hypercoagulable state     Past Surgical History  Procedure Date  . Tonsillectomy   . Shoulder surgery   . Percutaneous coronary intervention 2010    Bare-metal stent placed in teh mid circumflex    No family history on file.  History   Social History  . Marital Status: Single    Spouse Name: N/A    Number of Children: N/A  . Years of Education: N/A   Occupational History  . Not on file.   Social History Main Topics  . Smoking status: Never Smoker   . Smokeless tobacco: Not on file  . Alcohol Use: Not on file  . Drug Use: No  . Sexually Active: Not on file   Other Topics Concern  . Not on file   Social History Narrative   SingleLives with her boyfriendNo pregnancy    ROS: Please see the HPI.  All other systems reviewed and negative.  PHYSICAL EXAM:  BP 110/76  Pulse 53  Ht 5\' 7"  (1.702 m)  Wt 170 lb (77.111 kg)  BMI 26.63 kg/m2  General: Well developed, well nourished, in no acute distress. Head:  Normocephalic and atraumatic. Neck: no JVD Lungs: Clear to auscultation and percussion. Heart: Normal S1 and S2.  No murmur, rubs or  gallops.  Pulses: Pulses normal in all 4 extremities. Extremities: No clubbing or cyanosis. No edema. Neurologic: Alert and oriented x 3.  EKG:  SB, otherwise normal tracing.   ASSESSMENT AND PLAN:

## 2012-05-30 NOTE — Assessment & Plan Note (Signed)
The patient is been stable from a cardiac standpoint I plan to see her back in followup in about 6 months. After that, I will turn her over under the care of Dr. Excell Seltzer. If she has any interim issues, she is to call me.

## 2012-05-30 NOTE — Telephone Encounter (Signed)
Refilled simvastatin.

## 2012-06-13 ENCOUNTER — Telehealth: Payer: Self-pay | Admitting: Cardiology

## 2012-06-13 NOTE — Telephone Encounter (Signed)
New problem:  Calling back with information. Has a PCP Dr. Lubertha South in [place].

## 2012-06-14 NOTE — Telephone Encounter (Signed)
Noted information.  TS

## 2012-06-14 NOTE — Telephone Encounter (Signed)
I spoke with the pt and she said that Dr Riley Kill wanted her to contact our office when she established with a new PCP.  The pt's PCP is Dr Gerda Diss.  I have updated the pt's chart and will forward this message to Dr Riley Kill for review.

## 2012-10-03 ENCOUNTER — Telehealth: Payer: Self-pay | Admitting: Cardiology

## 2012-10-03 NOTE — Telephone Encounter (Signed)
The pt is due for a lipid and liver profile prior to next appointment with Dr Riley Kill.  I will mail an order to the pt's home to have this lab drawn at [facility]. Pt aware.

## 2012-10-03 NOTE — Telephone Encounter (Signed)
New Problem:    Patient called in needing the orders for her next lab draw sent to [facility] in [place].  Please call back.

## 2012-10-14 ENCOUNTER — Other Ambulatory Visit: Payer: Self-pay | Admitting: Cardiology

## 2012-10-14 LAB — HEPATIC FUNCTION PANEL
ALT: 15 U/L (ref 0–35)
AST: 18 U/L (ref 0–37)
Bilirubin, Direct: 0.2 mg/dL (ref 0.0–0.3)
Indirect Bilirubin: 0.3 mg/dL (ref 0.0–0.9)

## 2012-10-14 LAB — LIPID PANEL
Cholesterol: 106 mg/dL (ref 0–200)
Triglycerides: 42 mg/dL (ref <150)

## 2012-11-21 ENCOUNTER — Ambulatory Visit: Payer: 59 | Admitting: Cardiology

## 2012-12-08 ENCOUNTER — Encounter: Payer: Self-pay | Admitting: Cardiology

## 2012-12-08 ENCOUNTER — Ambulatory Visit (INDEPENDENT_AMBULATORY_CARE_PROVIDER_SITE_OTHER): Payer: 59 | Admitting: Cardiology

## 2012-12-08 VITALS — BP 112/64 | HR 72 | Ht 67.0 in | Wt 171.0 lb

## 2012-12-08 DIAGNOSIS — E7800 Pure hypercholesterolemia, unspecified: Secondary | ICD-10-CM

## 2012-12-08 DIAGNOSIS — I2510 Atherosclerotic heart disease of native coronary artery without angina pectoris: Secondary | ICD-10-CM

## 2012-12-08 NOTE — Progress Notes (Signed)
   HPI:  Erin Morris returns in follow up. She is generally doing very well.  Denies any chest pain.  Feels good overall.  Now working 4-10s but tolerating it.  We discussed her situation again in detail.  No current symptoms.    Current Outpatient Prescriptions  Medication Sig Dispense Refill  . aspirin 81 MG EC tablet Take 81 mg by mouth daily.        . Melatonin 5 MG CAPS Take 1 capsule by mouth daily.      . nitroGLYCERIN (NITROSTAT) 0.4 MG SL tablet Place 1 tablet (0.4 mg total) under the tongue every 5 (five) minutes as needed for chest pain.  25 tablet  4  . simvastatin (ZOCOR) 5 MG tablet Take 1 tablet (5 mg total) by mouth at bedtime.  90 tablet  3   No current facility-administered medications for this visit.    No Known Allergies  Past Medical History  Diagnosis Date  . CAD (coronary artery disease)     Percutaneous coronary intervention. Bare metal stent placed in the mid circumflex  . Primary hypercoagulable state     Past Surgical History  Procedure Laterality Date  . Tonsillectomy    . Shoulder surgery    . Percutaneous coronary intervention  2010    Bare-metal stent placed in teh mid circumflex    No family history on file.  History   Social History  . Marital Status: Single    Spouse Name: N/A    Number of Children: N/A  . Years of Education: N/A   Occupational History  . Not on file.   Social History Main Topics  . Smoking status: Never Smoker   . Smokeless tobacco: Not on file  . Alcohol Use: Not on file  . Drug Use: No  . Sexually Active: Not on file   Other Topics Concern  . Not on file   Social History Narrative   Single   Lives with her boyfriend   No pregnancy    ROS: Please see the HPI.  All other systems reviewed and negative.  PHYSICAL EXAM:  BP 112/64  Pulse 72  Ht 5\' 7"  (1.702 m)  Wt 171 lb (77.565 kg)  BMI 26.78 kg/m2  SpO2 99%  General: Well developed, well nourished, in no acute distress. Head:  Normocephalic and  atraumatic. Neck: no JVD Lungs: Clear to auscultation and percussion. Heart: Normal S1 and S2.  No murmur, rubs or gallops.  Abdomen:  Normal bowel sounds; soft; non tender; no organomegaly Pulses: Pulses normal in all 4 extremities. Extremities: No clubbing or cyanosis. No edema. Neurologic: Alert and oriented x 3.  EKG:  NSR.  Possible lateral MI, age indeterminate  ASSESSMENT AND PLAN:  1.  Prior lateral MI, with successful PCI    Plan FU with Dr. Diona Browner.

## 2012-12-08 NOTE — Patient Instructions (Addendum)
Your physician recommends that you schedule a follow-up appointment in: 3 MONTHS with Dr McDowell in the [place] office (previous pt of Dr Stuckey)  Your physician recommends that you continue on your current medications as directed. Please refer to the Current Medication list given to you today.  

## 2012-12-11 NOTE — Assessment & Plan Note (Signed)
Continues to remain stable.  No change in treatment plan.

## 2012-12-11 NOTE — Assessment & Plan Note (Signed)
?   Of mechanism of AMI.  However, suggestion of atherosclerotic issue.  Have opted for statins.

## 2013-02-20 ENCOUNTER — Ambulatory Visit: Payer: 59 | Admitting: Cardiology

## 2013-04-17 ENCOUNTER — Ambulatory Visit (INDEPENDENT_AMBULATORY_CARE_PROVIDER_SITE_OTHER): Payer: 59 | Admitting: Cardiology

## 2013-04-17 ENCOUNTER — Encounter: Payer: Self-pay | Admitting: Cardiology

## 2013-04-17 VITALS — BP 110/70 | HR 72 | Ht 67.0 in | Wt 161.0 lb

## 2013-04-17 DIAGNOSIS — I2510 Atherosclerotic heart disease of native coronary artery without angina pectoris: Secondary | ICD-10-CM

## 2013-04-17 DIAGNOSIS — E782 Mixed hyperlipidemia: Secondary | ICD-10-CM

## 2013-04-17 NOTE — Patient Instructions (Addendum)
Your physician recommends that you schedule a follow-up appointment in: 6 months   Your physician recommends that you return for lab work in: 6 months prior to OV Your physician recommends that you have follow up lab work, we will mail you a reminder letter to alert you when to go [employer], located across the street from our office.

## 2013-04-17 NOTE — Assessment & Plan Note (Signed)
History of lateral distribution NSTEMI in 2010 as outlined. Records do not find any clearly defined hypercoagulable state. She continues on aspirin and low-dose statin, doing quite well without active symptomatology. Recent ECG reviewed. Continue observation for now, six-month followup.

## 2013-04-17 NOTE — Assessment & Plan Note (Signed)
Continue low-dose Zocor 5 mg daily. Followup FLP and LFT for next visit.

## 2013-04-17 NOTE — Progress Notes (Signed)
   Clinical Summary Erin Morris is a 27 y.o.female presenting for an office visit, a former long-term patient of Dr. Riley Kill, last seen in March of this year. Records reviewed. 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 Reports no significant chest pain symptoms, rarely feels a "PVC." Reports regular exercise without major limitation. No dizziness or syncope. ECG from March reviewed showing sinus rhythm, small lateral Q waves.  Lab work in January showed total cholesterol 106, triglycerides 42, HDL 55, LDL 43, LFTs normal. She has been maintained on very low-dose statin therapy for plaque stabilization and reduction in inflammation.   No Known Allergies  Current Outpatient Prescriptions  Medication Sig Dispense Refill  . aspirin 81 MG EC tablet Take 81 mg by mouth daily.        . nitroGLYCERIN (NITROSTAT) 0.4 MG SL tablet Place 1 tablet (0.4 mg total) under the tongue every 5 (five) minutes as needed for chest pain.  25 tablet  4  . simvastatin (ZOCOR) 5 MG tablet Take 1 tablet (5 mg total) by mouth at bedtime.  90 tablet  3   No current facility-administered medications for this visit.    Past Medical History  Diagnosis Date  . Coronary atherosclerosis of native coronary artery     BMS to mid circumflex 2010  . NSTEMI (non-ST elevated myocardial infarction)     2010 - had prior workup for hypercoagulable state that was negative  . Mixed hyperlipidemia     Social History Erin Morris reports that she has never smoked. She does not have any smokeless tobacco history on file. Erin Morris reports that she does not drink alcohol.  Review of Systems Negative except as outlined.  Physical Examination Filed Vitals:   04/17/13 1516  BP: 110/70  Pulse: 72   Filed Weights   04/17/13 1516  Weight: 161 lb (73.029 kg)   Normally nourished appearing. Comfortable at rest. HEENT: Conjunctiva and lids normal, wearing glasses. Neck: Supple, no elevated  JVP. Lungs: Clear to auscultation, nonlabored breathing at rest. Cardiac: Regular rate and rhythm, no S3 or significant systolic murmur, no pericardial rub. Extremities: No edema evident. Skin: Warm and dry. Musculoskeletal: No kyphosis. Neuropsychiatric: Alert and oriented x3, affect grossly appropriate.   Problem List and Plan   Coronary atherosclerosis of native coronary artery History of lateral distribution NSTEMI in 2010 as outlined. Records do not find any clearly defined hypercoagulable state. She continues on aspirin and low-dose statin, doing quite well without active symptomatology. Recent ECG reviewed. Continue observation for now, six-month followup.  Hyperlipidemia Continue low-dose Zocor 5 mg daily. Followup FLP and LFT for next visit.    Jonelle Sidle, M.D., F.A.C.C.

## 2013-08-07 ENCOUNTER — Telehealth: Payer: Self-pay | Admitting: Cardiology

## 2013-08-07 MED ORDER — SIMVASTATIN 5 MG PO TABS: 5.0000 mg | ORAL_TABLET | Freq: Every day | ORAL | Status: DC

## 2013-08-07 NOTE — Telephone Encounter (Signed)
Received fax refill request  Rx #  Medication:  Simvastatin 10 mg tablet Qty: 90  Sig:  Take 1/2 tablet by mouth daily at bedtime Physician:  Diona Browner

## 2013-08-07 NOTE — Telephone Encounter (Signed)
Medication sent via escribe.  

## 2013-08-24 ENCOUNTER — Encounter: Payer: Self-pay | Admitting: *Deleted

## 2013-08-24 ENCOUNTER — Other Ambulatory Visit: Payer: Self-pay | Admitting: *Deleted

## 2013-08-24 DIAGNOSIS — I2510 Atherosclerotic heart disease of native coronary artery without angina pectoris: Secondary | ICD-10-CM

## 2013-08-24 DIAGNOSIS — E782 Mixed hyperlipidemia: Secondary | ICD-10-CM

## 2013-10-24 ENCOUNTER — Encounter: Payer: Self-pay | Admitting: *Deleted

## 2013-10-24 LAB — HEPATIC FUNCTION PANEL
ALT: 18 U/L (ref 0–35)
AST: 24 U/L (ref 0–37)
Albumin: 4.2 g/dL (ref 3.5–5.2)
Alkaline Phosphatase: 48 U/L (ref 39–117)
Bilirubin, Direct: 0.2 mg/dL (ref 0.0–0.3)
Indirect Bilirubin: 0.5 mg/dL (ref 0.2–1.2)
Total Bilirubin: 0.7 mg/dL (ref 0.2–1.2)
Total Protein: 6.5 g/dL (ref 6.0–8.3)

## 2013-10-24 LAB — LIPID PANEL
Cholesterol: 109 mg/dL (ref 0–200)
HDL: 64 mg/dL (ref >39)
LDL Cholesterol: 31 mg/dL (ref 0–99)
Total CHOL/HDL Ratio: 1.7 Ratio
Triglycerides: 70 mg/dL (ref <150)
VLDL: 14 mg/dL (ref 0–40)

## 2013-12-14 ENCOUNTER — Encounter (HOSPITAL_COMMUNITY): Payer: Self-pay | Admitting: Emergency Medicine

## 2013-12-14 ENCOUNTER — Observation Stay (HOSPITAL_COMMUNITY)
Admission: EM | Admit: 2013-12-14 | Discharge: 2013-12-16 | Disposition: A | Discharge status: Home / Self Care | Payer: 59 | Source: Other Acute Inpatient Hospital | Attending: Cardiology | Admitting: Cardiology

## 2013-12-14 ENCOUNTER — Encounter: Payer: Self-pay | Admitting: Adult Health

## 2013-12-14 ENCOUNTER — Ambulatory Visit (INDEPENDENT_AMBULATORY_CARE_PROVIDER_SITE_OTHER): Payer: 59 | Admitting: Adult Health

## 2013-12-14 ENCOUNTER — Inpatient Hospital Stay (HOSPITAL_COMMUNITY): Admission: AD | Admit: 2013-12-14 | Payer: 59 | Source: Ambulatory Visit | Admitting: Internal Medicine

## 2013-12-14 VITALS — BP 107/62 | HR 77 | Resp 12 | Ht 67.0 in | Wt 162.0 lb

## 2013-12-14 DIAGNOSIS — R0789 Other chest pain: Secondary | ICD-10-CM

## 2013-12-14 DIAGNOSIS — I200 Unstable angina: Secondary | ICD-10-CM

## 2013-12-14 DIAGNOSIS — I517 Cardiomegaly: Secondary | ICD-10-CM | POA: Insufficient documentation

## 2013-12-14 DIAGNOSIS — M25519 Pain in unspecified shoulder: Secondary | ICD-10-CM | POA: Insufficient documentation

## 2013-12-14 DIAGNOSIS — R0602 Shortness of breath: Secondary | ICD-10-CM | POA: Insufficient documentation

## 2013-12-14 DIAGNOSIS — E785 Hyperlipidemia, unspecified: Secondary | ICD-10-CM

## 2013-12-14 DIAGNOSIS — E782 Mixed hyperlipidemia: Secondary | ICD-10-CM | POA: Insufficient documentation

## 2013-12-14 DIAGNOSIS — I25110 Atherosclerotic heart disease of native coronary artery with unstable angina pectoris: Secondary | ICD-10-CM | POA: Diagnosis present

## 2013-12-14 DIAGNOSIS — I252 Old myocardial infarction: Secondary | ICD-10-CM | POA: Insufficient documentation

## 2013-12-14 DIAGNOSIS — I2510 Atherosclerotic heart disease of native coronary artery without angina pectoris: Principal | ICD-10-CM | POA: Insufficient documentation

## 2013-12-14 DIAGNOSIS — Z7982 Long term (current) use of aspirin: Secondary | ICD-10-CM | POA: Insufficient documentation

## 2013-12-14 DIAGNOSIS — Z9861 Coronary angioplasty status: Secondary | ICD-10-CM | POA: Insufficient documentation

## 2013-12-14 LAB — CBC WITH DIFFERENTIAL/PLATELET
Basophils Absolute: 0 10*3/uL (ref 0.0–0.1)
Basophils Relative: 0 % (ref 0–1)
Eosinophils Absolute: 0.1 10*3/uL (ref 0.0–0.7)
Eosinophils Relative: 1 % (ref 0–5)
HCT: 37.4 % (ref 36.0–46.0)
Hemoglobin: 12.5 g/dL (ref 12.0–15.0)
LYMPHS ABS: 1.2 10*3/uL (ref 0.7–4.0)
Lymphocytes Relative: 22 % (ref 12–46)
MCH: 29.5 pg (ref 26.0–34.0)
MCHC: 33.4 g/dL (ref 30.0–36.0)
MCV: 88.2 fL (ref 78.0–100.0)
MONO ABS: 0.2 10*3/uL (ref 0.1–1.0)
Monocytes Relative: 4 % (ref 3–12)
NEUTROS ABS: 3.9 10*3/uL (ref 1.7–7.7)
NEUTROS PCT: 73 % (ref 43–77)
Platelets: 157 10*3/uL (ref 150–400)
RBC: 4.24 MIL/uL (ref 3.87–5.11)
RDW: 14.2 % (ref 11.5–15.5)
WBC: 5.3 10*3/uL (ref 4.0–10.5)

## 2013-12-14 LAB — MRSA PCR SCREENING: MRSA by PCR: NEGATIVE

## 2013-12-14 LAB — CREATININE, SERUM
CREATININE: 0.7 mg/dL (ref 0.50–1.10)
GFR calc Af Amer: >90 mL/min (ref >90)
GFR calc non Af Amer: >90 mL/min (ref >90)

## 2013-12-14 LAB — TROPONIN I: Troponin I: <0.3 ng/mL (ref <0.30)

## 2013-12-14 MED ORDER — ENOXAPARIN SODIUM 40 MG/0.4ML ~~LOC~~ SOLN
40.0000 mg | SUBCUTANEOUS | Status: DC
  Administered 2013-12-14 – 2013-12-15 (×2): 40 mg via SUBCUTANEOUS
  Filled 2013-12-14 (×3): qty 0.4

## 2013-12-14 MED ORDER — ASPIRIN 300 MG RE SUPP
300.0000 mg | RECTAL | Status: AC
  Filled 2013-12-14: qty 1

## 2013-12-14 MED ORDER — ASPIRIN EC 81 MG PO TBEC
81.0000 mg | DELAYED_RELEASE_TABLET | Freq: Every day | ORAL | Status: DC
  Administered 2013-12-15: 81 mg via ORAL
  Filled 2013-12-14 (×2): qty 1

## 2013-12-14 MED ORDER — NITROGLYCERIN 2 % TD OINT
0.5000 [in_us] | TOPICAL_OINTMENT | Freq: Four times a day (QID) | TRANSDERMAL | Status: DC
  Filled 2013-12-14: qty 30

## 2013-12-14 MED ORDER — ACETAMINOPHEN 325 MG PO TABS: 650.0000 mg | ORAL_TABLET | ORAL | Status: DC | PRN

## 2013-12-14 MED ORDER — METOPROLOL TARTRATE 12.5 MG HALF TABLET
12.5000 mg | ORAL_TABLET | Freq: Two times a day (BID) | ORAL | Status: DC
  Filled 2013-12-14 (×5): qty 1

## 2013-12-14 MED ORDER — ASPIRIN 81 MG PO CHEW
324.0000 mg | CHEWABLE_TABLET | ORAL | Status: AC
  Administered 2013-12-14: 324 mg via ORAL

## 2013-12-14 MED ORDER — ALPRAZOLAM 0.25 MG PO TABS: 0.2500 mg | ORAL_TABLET | Freq: Two times a day (BID) | ORAL | Status: DC | PRN

## 2013-12-14 MED ORDER — ONDANSETRON HCL 4 MG/2ML IJ SOLN
4.0000 mg | Freq: Four times a day (QID) | INTRAMUSCULAR | Status: DC | PRN
  Administered 2013-12-15: 4 mg via INTRAVENOUS
  Filled 2013-12-14: qty 2

## 2013-12-14 MED ORDER — NITROGLYCERIN 0.4 MG SL SUBL
0.4000 mg | SUBLINGUAL_TABLET | SUBLINGUAL | Status: DC | PRN
  Administered 2013-12-15 (×2): 0.4 mg via SUBLINGUAL
  Filled 2013-12-14: qty 1

## 2013-12-14 NOTE — Progress Notes (Signed)
Addendum:1700 hrs  Patient for admit to [facility] in patient tele via [facility]  NTG 0.4 mg SL x 2 given for right scapula with radiation to chest,upon arrival 8/10,reduced to 3/10 after NTG  Chewable ASA 81 mg x 4 (324 mg total given)  Nitro paste 1" to chest wall applied  IV acess #18 angio left AC flushed with NS 3 ml  O2 2 L [place] applied   Cardiac monitor SR, no ectopy noted, no acute ST changes noted    C.Wilber Oliphant RN

## 2013-12-14 NOTE — Progress Notes (Signed)
Name: Erin Morris    DOB: 07-02-1986  Age: 28 y.o.  MR#: [id number]       PCP:  Sallee Lange, MD      Insurance: Payor: [employer] / Plan: [employer] / Product Type: *No Product type* /   CC:    Chief Complaint  Patient presents with  . Coronary Artery Disease    Stent to CX     VS Filed Vitals:   12/14/13 1640  BP: 123/66  Pulse: 88  Height: 5' 7"  (1.702 m)  Weight: 162 lb (73.483 kg)  SpO2: 99%    Weights Current Weight  12/14/13 162 lb (73.483 kg)  04/17/13 161 lb (73.029 kg)  12/08/12 171 lb (77.565 kg)    Blood Pressure  BP Readings from Last 3 Encounters:  12/14/13 123/66  04/17/13 110/70  12/08/12 112/64     Admit date:  (Not on file) Last encounter with RMR:  Visit date not found   Allergy Review of patient's allergies indicates no known allergies.  Current Outpatient Prescriptions  Medication Sig Dispense Refill  . aspirin 81 MG EC tablet Take 81 mg by mouth daily.        . simvastatin (ZOCOR) 5 MG tablet Take 1 tablet (5 mg total) by mouth at bedtime.  90 tablet  3  . nitroGLYCERIN (NITROSTAT) 0.4 MG SL tablet Place 1 tablet (0.4 mg total) under the tongue every 5 (five) minutes as needed for chest pain.  25 tablet  4   No current facility-administered medications for this visit.    Discontinued Meds:   There are no discontinued medications.  Patient Active Problem List   Diagnosis Date Noted  . Hyperlipidemia 05/14/2009  . Coronary atherosclerosis of native coronary artery 04/16/2009    LABS    Component Value Date/Time   NA 141 05/16/2009 1033   NA 140 04/29/2009 2233   NA 141 04/25/2009 0959   K 3.7 05/16/2009 1033   K 4.8 04/29/2009 2233   K 5.2* 04/25/2009 0959   CL 109 05/16/2009 1033   CL 107 04/29/2009 2233   CL 106 04/25/2009 0959   CO2 19 05/16/2009 1033   CO2 24 04/29/2009 2233   CO2 24 04/25/2009 0959   GLUCOSE 91 05/16/2009 1033   GLUCOSE 86 04/29/2009 2233   GLUCOSE 96 04/25/2009 0959   BUN 10 05/16/2009 1033   BUN 14 04/29/2009  2233   BUN 18 04/25/2009 0959   CREATININE 0.57 05/16/2009 1033   CREATININE 0.74 04/29/2009 2233   CREATININE 0.8 04/25/2009 0959   CALCIUM 8.8 05/16/2009 1033   CALCIUM 9.3 04/29/2009 2233   CALCIUM 9.9 04/25/2009 0959   GFRNONAA 94.24 04/25/2009 0959   GFRNONAA >60 04/10/2009 0415   GFRNONAA >60 04/09/2009 1324   GFRAA  Value: >60        The eGFR has been calculated using the MDRD equation. This calculation has not been validated in all clinical situations. eGFR's persistently <60 mL/min signify possible Chronic Kidney Disease. 04/10/2009 0415   GFRAA  Value: >60        The eGFR has been calculated using the MDRD equation. This calculation has not been validated in all clinical situations. eGFR's persistently <60 mL/min signify possible Chronic Kidney Disease. 04/09/2009 0635   GFRAA  Value: >60        The eGFR has been calculated using the MDRD equation. This calculation has not been validated in all clinical situations. eGFR's persistently <60 mL/min signify possible  Chronic Kidney Disease. 04/08/2009 1115   CMP     Component Value Date/Time   NA 141 05/16/2009 1033   K 3.7 05/16/2009 1033   CL 109 05/16/2009 1033   CO2 19 05/16/2009 1033   GLUCOSE 91 05/16/2009 1033   BUN 10 05/16/2009 1033   CREATININE 0.57 05/16/2009 1033   CALCIUM 8.8 05/16/2009 1033   PROT 6.5 10/23/2013 1444   ALBUMIN 4.2 10/23/2013 1444   AST 24 10/23/2013 1444   ALT 18 10/23/2013 1444   ALKPHOS 48 10/23/2013 1444   BILITOT 0.7 10/23/2013 1444   GFRNONAA 94.24 04/25/2009 0959   GFRAA  Value: >60        The eGFR has been calculated using the MDRD equation. This calculation has not been validated in all clinical situations. eGFR's persistently <60 mL/min signify possible Chronic Kidney Disease. 04/10/2009 0415       Component Value Date/Time   WBC 4.2 06/28/2009 2057   WBC 3.2* 05/16/2009 0920   WBC 8.1 04/12/2009 0500   WBC 6.1 04/10/2009 0415   HGB 11.6* 06/28/2009 2057   HGB 11.5* 05/16/2009 0920   HGB 12.3 04/12/2009 0500   HGB 11.4*  04/10/2009 0415   HCT 37.9 06/28/2009 2057   HCT 34.2* 05/16/2009 0920   HCT 36.0 04/12/2009 0500   HCT 33.9* 04/10/2009 0415   MCV 87.7 06/28/2009 2057   MCV 87.4 05/16/2009 0920   MCV 88.3 04/12/2009 0500   MCV 88.7 04/10/2009 0415    Lipid Panel     Component Value Date/Time   CHOL 109 10/23/2013 1448   TRIG 70 10/23/2013 1448   HDL 64 10/23/2013 1448   CHOLHDL 1.7 10/23/2013 1448   VLDL 14 10/23/2013 1448   LDLCALC 31 10/23/2013 1448    ABG No results found for this basename: phart, pco2, pco2art, po2, po2art, hco3, tco2, acidbasedef, o2sat     Lab Results  Component Value Date   TSH 2.875 08/13/2010   BNP (last 3 results) No results found for this basename: PROBNP,  in the last 8760 hours Cardiac Panel (last 3 results) No results found for this basename: CKTOTAL, CKMB, TROPONINI, RELINDX,  in the last 72 hours  Iron/TIBC/Ferritin No results found for this basename: iron, tibc, ferritin     EKG Orders placed in visit on 12/14/13  . EKG 12-LEAD     Prior Assessment and Plan Problem List as of 12/14/2013     Cardiovascular and Mediastinum   Coronary atherosclerosis of native coronary artery   Last Assessment & Plan   04/17/2013 Office Visit Written 04/17/2013  3:40 PM by Satira Sark, MD     History of lateral distribution NSTEMI in 2010 as outlined. Records do not find any clearly defined hypercoagulable state. She continues on aspirin and low-dose statin, doing quite well without active symptomatology. Recent ECG reviewed. Continue observation for now, six-month followup.      Other   Hyperlipidemia   Last Assessment & Plan   04/17/2013 Office Visit Written 04/17/2013  3:41 PM by Satira Sark, MD     Continue low-dose Zocor 5 mg daily. Followup FLP and LFT for next visit.        Imaging: No results found.

## 2013-12-14 NOTE — Progress Notes (Signed)
CARDIOLOGY HISTORY AND PHYSICAL   Patient UD:[id number] Erin Morris MRN: [id number]  DOB/AGE: [birth year] 28 y.o. Admit date: (Not on file)  Primary Care Physician: Sallee Lange, MD Primary Cardiologist: Formerly, Lia Foyer. Accepting physician Candee Furbish, MD  Clinical Summary Erin Morris is a 28 y.o.female with known history of premature coronary artery disease, with myocardial infarction at age 77 requiring cardiac catheterization and bare-metal stent to the circumflex artery by Dr. Addison Lank in 2010. The patient was on dual antiplatelet therapy for one year, she is continued on simvastatin, and aspirin now.  The patient was in her usual state of health without any difficulties until around 3:30 AM when she began to have right sided chest pressure and pain with sharp pain radiating through her clavicle to her scapula. This pain was very similar to what she experienced with her first myocardial infarction in 2010. The only difference was she had some associated shortness of breath and the pain was in her bilateral shoulders at that time.  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 She works the 4 pieces 3:30 shift at [employer], and states approximately 3 days later she had a heavy patient greater than 400 pounds she was pushing around in a wheelchair. She did not have any muscle aches or pains 2 days afterwards. However last night as she was getting off work she noticed a pressure in her shoulder and chest. The pain is coming going lasting several minutes with recurrent sharpness radiating through to her right scapula.  She first was reluctant to be seen thinking it was musculoskeletal pain, she actually moved her arm and try to lift something or pushups and with the arm to see if he would make things worse. It did not. While at work today she called to see if she could be seen by cardiology for assessment of her recurrent  pain.  In office, the patient was given a sublingual nitroglycerin with some relief of the chest pressure, but it quickly returned. We also gave her a second nitroglycerin and had her line down. Initial blood pressure when she came in his was 123/66 with a heart rate of 83 beats per minute. EKG was performed on site revealing normal sinus rhythm with right atrial enlargement and Q waves in V4 through V6.  I spoke with Dr. Bronson Ing, DOD on site, who recommended that she be admitted to [facility] for overnight observation. I spoke with Dr. Hurshel Party MD, hospitalist for [facility], who was uncomfortable with admission with her ongoing symptoms and prior history.  I spoke with Dr. Candee Furbish, DOD at [facility], who is willing to accept the patient. [facility] and will be transporting patient [place] [facility] for admission to step down. In the interim we have placed 1 inch of nitroglycerin on her chest.  I discussed this all with the patient in our office, who was willing to be admitted. She is a little anxious now that we are making plans to have her be at [facility], and also states that she does not want this to be another repeat of prior MI in 2010 that had been ordered initially due to her age.    No Known Allergies  Home Medications  Simvastatin 5 mg daily  ASA 81 mg Daily  Scheduled Medications    Infusions    PRN Medications       Past  Medical History  Diagnosis Date  . Coronary atherosclerosis of native coronary artery     BMS to mid circumflex 2010  . NSTEMI (non-ST elevated myocardial infarction)     2010 - had prior workup for hypercoagulable state that was negative  . Mixed hyperlipidemia     Past Surgical History  Procedure Laterality Date  . Tonsillectomy    . Shoulder surgery      Family History  Problem Relation Age of Onset  . Heart disease Paternal Grandfather     Social History Erin Morris reports that she  has never smoked. She does not have any smokeless tobacco history on file. Erin Morris reports that she does not drink alcohol.  Review of Systems Otherwise reviewed and negative except as outlined.  Physical Examination: BP 123/66, HR 83, Resp: 20. Wt 162 lbs.  General: Well developed, well nourished, complaining of right sided chest pressure. Head: Eyes PERRLA, No xanthomas.   Normal cephalic and atramatic  Lungs: Clear bilaterally to auscultation and percussion. Heart: HRRR S1 S2, without MRG.  Pulses are 2+ & equal.            No carotid bruit. No JVD.  No abdominal bruits. No femoral bruits. Abdomen: Bowel sounds are positive, abdomen soft and non-tender without masses or                  Hernia's noted. Msk:  Back normal, normal gait. Normal strength and tone for age. Extremities: No clubbing, cyanosis or edema.  DP +1 Neuro: Alert and oriented X 3. Psych:  Good affect, responds appropriately   Prior Cardiac Testing and Procedures: Cath 2010 Review of cardiac catheterization data demonstrated normal ejection fraction of 50-55%, the left main was free of disease the LAD was moved throughout with a large diagonal and several smaller diagonal branches no significant focal obstruction was noted. The circumflex artery was occluded after 2 small marginal branches, there was a hang up of contrast at the site, multiple passes with aspiration thrombectomy catheter did not dramatically change the appearance that was as changed by stenting of the vessel. It was a smaller AV groove branch that supplied a very small amount of myocardial. 3. This was occluded in the area covered by the stent. The marginal branch itself is extraordinary large and following stenting there was a normal appearance to the vessel without evidence of distal embolization and the distal vessel trifurcated. The right coronary artery appeared smooth throughout and provides a posterior descending branch and a small posterior  lateral system. The circumflex was stented with a [employer] 2.75 x 24 Veriflex stent.  Lab Results Pending admission labs.  Radiology Pending admission labs    ECG NSR with Right Atrial Enlargement  Possible Lateral Infarct  Impression and Recommendations  1. Unstable Angina with known hx of CAD: Known history of premature coronary artery disease with bare-metal stent to the circumflex artery in 2010 per Dr. Lia Foyer. The patient then underwent a platelet therapy for one year with aspirin and Plavix. She continues on statin and aspirin daily. The patient is having symptoms of unstable angina which he describes as chest pressure on the right radiating through to her right scapula with occasional sharp pain. Pain is very similar to the pain she had with her prior MI in 2010, with the exception of some shortness of breath, and the pain being bilateral shoulders.  Uncertain if this is musculoskeletal vs. cardiac in etiology. The plan was to admit her to [facility] to rule out MI, with planned stress test in the a.m. However, due to her history and ongoing symptoms, and at hospitalist was uncomfortable with admission, and requested that the patient be admitted to [facility] for further evaluation.  This was discussed with Dr. Candee Furbish is willing to accept this patient on transfer. In the interim, the patient has had a nitroglycerin paste 1 inch placed topically, an IV has been started. She will be admitted to [facility] for observation with planned cath vs. stress test in a.m. We will keep her n.p.o. after midnight until decision is made.  This has been discussed with the patient and her boyfriend in our office, who verbalized understanding and are willing to proceed with transfer to [facility] hospital.  2. Hyperlipidemia: Patient continues on simvastatin 5 mg daily. Recheck lipids and LFTs.  Signed: Jory Sims 12/14/2013, 5:33 PM Co-Sign MD

## 2013-12-14 NOTE — Progress Notes (Signed)
1830 hrs: Tylenol 650 mg po given for HA K.Lawrence NP, monitor remains NSR, no ectopy,or ST elevation noted.patient rates pain in right side of chest 3/10 described as tightness.  VSS 100/60 (baseline BP for her) -75-16    Family at bedside. Update that EMS with ETA 20 minutes   C.Wilber Oliphant RN

## 2013-12-14 NOTE — H&P (Signed)
Patient Erin Morris Erin Morris MRN: [id number]  DOB/AGE: [birth year] 28 y.o. Admit date: (Not on file)  Primary Care Physician: Sallee Lange, MD Primary Cardiologist: Formerly, Lia Foyer. Accepting physician Candee Furbish, MD  Clinical Summary [name] is a 28 y.o.female with known history of premature coronary artery disease, with myocardial infarction at age 21 requiring cardiac catheterization and bare-metal stent to the circumflex artery by Dr. Addison Lank in 2010. The patient was on dual antiplatelet therapy for one year, she is continued on simvastatin, and aspirin now.  The patient was in her usual state of health without any difficulties until around 3:30 AM when she began to have right sided chest pressure and pain with sharp pain radiating through her clavicle to her scapula. This pain was very similar to what she experienced with her first myocardial infarction in 2010. The only difference was she had some associated shortness of breath and the pain was in her bilateral shoulders at that time.  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 She did not have any muscle aches or pains 2 days afterwards. However last night as she was getting off work she noticed a pressure in her shoulder and chest. The pain is coming going lasting several minutes with recurrent sharpness radiating through to her right scapula.  She first was reluctant to be seen thinking it was musculoskeletal pain, she actually moved her arm and try to lift something or pushups and with the arm to see if he would make things worse. It did not. While at work today she called to see if she could be seen by cardiology for assessment of her recurrent pain.  In office, the patient was given a  sublingual nitroglycerin with some relief of the chest pressure, but it quickly returned. We also gave her a second nitroglycerin and had her line down. Initial blood pressure when she came in his was 123/66 with a heart rate of 83 beats per minute. EKG was performed on site revealing normal sinus rhythm with right atrial enlargement and Q waves in V4 through V6.  I spoke with Dr. Bronson Ing, DOD on site, who recommended that she be admitted to [facility] for overnight observation. I spoke with Dr. Hurshel Party MD, hospitalist for [facility], who was uncomfortable with admission with her ongoing symptoms and prior history.  I spoke with Dr. Candee Furbish, DOD at [facility], who is willing to accept the patient. [facility] and will be transporting patient [place] [facility] for admission to step down. In the interim we have placed 1 inch of nitroglycerin on her chest.  I discussed this all with the patient in our office, who was willing to be admitted. She is a little anxious now that we are making plans to have her be at [facility], and also states that she does not want this to be another repeat of prior MI in 2010 that had been ordered initially due to her age.    No Known Allergies  Home Medications  Simvastatin 5 mg daily  ASA 81 mg Daily  Scheduled Medications    Infusions    PRN Medications       Past Medical History  Diagnosis Date  . Coronary  atherosclerosis of native coronary artery     BMS to mid circumflex 2010  . NSTEMI (non-ST elevated myocardial infarction)     2010 - had prior workup for hypercoagulable state that was negative  . Mixed hyperlipidemia     Past Surgical History  Procedure Laterality Date  . Tonsillectomy    . Shoulder surgery      Family History  Problem Relation Age of Onset  . Heart disease Paternal Grandfather     Social History [name] reports that she has never smoked. She does not have any  smokeless tobacco history on file. [name] reports that she does not drink alcohol.  Review of Systems Otherwise reviewed and negative except as outlined.  Physical Examination: BP 123/66, HR 83, Resp: 20. Wt 162 lbs.  General: Well developed, well nourished, complaining of right sided chest pressure. Head: Eyes PERRLA, No xanthomas.   Normal cephalic and atramatic  Lungs: Clear bilaterally to auscultation and percussion. Heart: HRRR S1 S2, without MRG.  Pulses are 2+ & equal.            No carotid bruit. No JVD.  No abdominal bruits. No femoral bruits. Abdomen: Bowel sounds are positive, abdomen soft and non-tender without masses or                  Hernia's noted. Msk:  Back normal, normal gait. Normal strength and tone for age. Extremities: No clubbing, cyanosis or edema.  DP +1 Neuro: Alert and oriented X 3. Psych:  Good affect, responds appropriately   Prior Cardiac Testing and Procedures: Cath 2010 Review of cardiac catheterization data demonstrated normal ejection fraction of 50-55%, the left main was free of disease the LAD was moved throughout with a large diagonal and several smaller diagonal branches no significant focal obstruction was noted. The circumflex artery was occluded after 2 small marginal branches, there was a hang up of contrast at the site, multiple passes with aspiration thrombectomy catheter did not dramatically change the appearance that was as changed by stenting of the vessel. It was a smaller AV groove branch that supplied a very small amount of myocardial. 3. This was occluded in the area covered by the stent. The marginal branch itself is extraordinary large and following stenting there was a normal appearance to the vessel without evidence of distal embolization and the distal vessel trifurcated. The right coronary artery appeared smooth throughout and provides a posterior descending branch and a small posterior lateral system. The circumflex was stented  with a [employer] 2.75 x 24 Veriflex stent.  Lab Results Pending admission labs.  Radiology Pending admission labs    ECG NSR with Right Atrial Enlargement  Possible Lateral Infarct  Impression and Recommendations  1. Unstable Angina with known hx of CAD: Known history of premature coronary artery disease with bare-metal stent to the circumflex artery in 2010 per Dr. Lia Foyer. The patient then underwent a platelet therapy for one year with aspirin and Plavix. She continues on statin and aspirin daily. The patient is having symptoms of unstable angina which he describes as chest pressure on the right radiating through to her right scapula with occasional sharp pain. Pain is very similar to the pain she had with her prior MI in 2010, with the exception of some shortness of breath, and the pain being bilateral shoulders.  Uncertain if this is musculoskeletal vs. cardiac in etiology. The plan was to admit her to [facility] to rule out MI, with planned  stress test in the a.m. However, due to her history and ongoing symptoms, and at hospitalist was uncomfortable with admission, and requested that the patient be admitted to [facility] for further evaluation.  This was discussed with Dr. Candee Furbish is willing to accept this patient on transfer. In the interim, the patient has had a nitroglycerin paste 1 inch placed topically, an IV has been started. She will be admitted to [facility] for observation with planned cath vs. stress test in a.m. We will keep her n.p.o. after midnight until decision is made.  This has been discussed with the patient and her boyfriend in our office, who verbalized understanding and are willing to proceed with transfer to [facility] hospital.  2. Hyperlipidemia: Patient continues on simvastatin 5 mg daily. Recheck lipids and LFTs.  Signed: Jory Sims 12/14/2013, 5:33 PM Co-Sign MD

## 2013-12-14 NOTE — H&P (Signed)
Patient Erin Morris:[id number]  Erin Morris  MRN: [id number]  DOB/AGE: 04/03/1986 28 y.o.   Primary Care Physician: Sallee Lange, MD   Primary Cardiologist: Formerly, Lia Foyer. Accepting physician Candee Furbish, MD (Has seen Dr. Domenic Polite)  Clinical Summary  Erin Morris is a 28 y.o.female with known history of premature coronary artery disease, with myocardial infarction at age 44 requiring cardiac catheterization and bare-metal stent to the circumflex artery by Dr. Addison Lank in 2010. The patient was on dual antiplatelet therapy for one year, she is continued on simvastatin, and aspirin now.  The patient was in her usual state of health without any difficulties until around 3:30 AM when she began to have right sided chest pressure and pain with sharp pain radiating through her clavicle to her scapula. This pain was very similar to what she experienced with her first myocardial infarction in 2010. The only difference was she had some associated shortness of breath and the pain was in her bilateral shoulders at that time.   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 She did not have any muscle aches or pains 2 days afterwards. However last night as she was getting off work she noticed a pressure in her shoulder and chest. The pain is coming going lasting several minutes with recurrent sharpness radiating through to her right scapula.   She first was reluctant to be seen thinking it was musculoskeletal pain, she actually moved her arm and try to lift something or pushups and with the arm to see if he would make things worse. It did not. While at work today she called to see if she could be seen by cardiology for assessment of her recurrent pain.   In office, the patient was  given a sublingual nitroglycerin with some relief of the chest pressure, but it quickly returned. We also gave her a second nitroglycerin and had her line down. Initial blood pressure when she came in his was 123/66 with a heart rate of 83 beats per minute. EKG was performed on site revealing normal sinus rhythm with right atrial enlargement and Q waves in V4 through V6.   I spoke with Dr. Bronson Ing, DOD on site, who recommended that she be admitted to [facility] for overnight observation. I spoke with Dr. Hurshel Party MD, hospitalist for [facility], who was uncomfortable with admission with her ongoing symptoms and prior history. I spoke with Dr. Candee Furbish, DOD at [facility], who is willing to accept the patient. [facility] has been called and will be transporting patient [place] hospital for admission to step down. In the interim we have placed 1 inch of nitroglycerin on her chest.   I discussed this all with the patient in our office, who was willing to be admitted. She is a little anxious now that we are making plans to have her be at [facility], and also states that she does not want this to be another repeat of prior MI in 2010 that had been ordered initially due to her age.   No Known Allergies   Home Medications  Simvastatin 5 mg daily  ASA 81 mg Daily    Past Medical History   Diagnosis  Date   .  Coronary  atherosclerosis of native coronary artery      BMS to mid circumflex 2010   .  NSTEMI (non-ST elevated myocardial infarction)      2010 - had prior workup for hypercoagulable state that was negative   .  Mixed hyperlipidemia     Past Surgical History   Procedure  Laterality  Date   .  Tonsillectomy     .  Shoulder surgery      Family History   Problem  Relation  Age of Onset   .  Heart disease  Paternal Grandfather     Social History  Erin Morris reports that she has never smoked. She does not have any smokeless tobacco history on file.  Erin Morris  reports that she does not drink alcohol.   Review of Systems  Otherwise reviewed and negative except as outlined.   Physical Examination:  BP 123/66, HR 83, Resp: 20. Wt 162 lbs.  General: Well developed, well nourished, complaining of right sided chest pressure.  Head: Eyes PERRLA, No xanthomas. Normal cephalic and atramatic  Lungs: Clear bilaterally to auscultation and percussion.  Heart: HRRR S1 S2, without MRG. Pulses are 2+ & equal.  No carotid bruit. No JVD. No abdominal bruits. No femoral bruits.  Abdomen: Bowel sounds are positive, abdomen soft and non-tender without masses or  Hernia's noted.  Msk: Back normal, normal gait. Normal strength and tone for age.  Extremities: No clubbing, cyanosis or edema. DP +1  Neuro: Alert and oriented X 3.  Psych: Good affect, responds appropriately   Prior Cardiac Testing and Procedures: Cath 2010  Review of cardiac catheterization data demonstrated normal ejection fraction of 50-55%, the left main was free of disease the LAD was moved throughout with a large diagonal and several smaller diagonal branches no significant focal obstruction was noted. The circumflex artery was occluded after 2 small marginal branches, there was a hang up of contrast at the site, multiple passes with aspiration thrombectomy catheter did not dramatically change the appearance that was as changed by stenting of the vessel. It was a smaller AV groove branch that supplied a very small amount of myocardial. 3. This was occluded in the area covered by the stent. The marginal branch itself is extraordinary large and following stenting there was a normal appearance to the vessel without evidence of distal embolization and the distal vessel trifurcated. The right coronary artery appeared smooth throughout and provides a posterior descending branch and a small posterior lateral system. The circumflex was stented with a [employer] 2.75 x 24 Veriflex stent.   Lab Results    Pending admission labs.   Radiology  Pending admission labs   ECG  NSR with Right Atrial Enlargement  Possible Lateral Infarct   Impression and Recommendations  1. Unstable Angina with known hx of CAD: Known history of premature coronary artery disease with bare-metal stent to the circumflex artery in 2010 per Dr. Lia Foyer. The patient then underwent a platelet therapy for one year with aspirin and Plavix. She continues on statin and aspirin daily. The patient is having symptoms of unstable angina which he describes as chest pressure on the right radiating through to her right scapula with occasional sharp pain. Pain is very similar to the pain she had with her prior MI in 2010, with the exception of some shortness of breath, and the pain being bilateral shoulders.   Uncertain if this is musculoskeletal vs. cardiac in etiology. The plan was to admit her to [facility]  hospital to rule out MI, with planned stress test in the a.m. However, due to her history and ongoing symptoms, and at hospitalist was uncomfortable with admission, and requested that the patient be admitted to [facility] for further evaluation.   This was discussed with Dr. Candee Furbish is willing to accept this patient on transfer. In the interim, the patient has had a nitroglycerin paste 1 inch placed topically, an IV has been started. She will be admitted to [facility] for observation with planned cath vs. stress test in a.m. We will keep her n.p.o. after midnight until decision is made.   This has been discussed with the patient and her boyfriend in our office, who verbalized understanding and are willing to proceed with transfer to [facility] hospital.   2. Hyperlipidemia: Patient continues on simvastatin 5 mg daily. Recheck lipids and LFTs.   Signed:  Jory Sims  12/14/2013, 5:33 PM  Personally seen and examined and I agree with above.  Will make NPO for stress ECHO in the AM.    Obviously if Troponin  becomes positive or ECG changes or if she has more severe symptoms, we will proceed with cath.   She is agreeable with plan.   No familial hyperlipidemia. No early CAD hx.    Candee Furbish, MD

## 2013-12-14 NOTE — Progress Notes (Deleted)
    CARDIOLOGY CONSULT NOTE   Patient ID: Erin Morris MRN: [id number] DOB/AGE: 1986-04-06 28 y.o.  Admit Date: (Not on file) Referring Physician: *** Primary Physician: Sallee Lange, MD Consulting Cardiologist: *** Primary Cardiologist *** Reason for Consultation: ***  Clinical Summary [name] is a 28 y.o.female   No Known Allergies  Medications Scheduled Medications:    Infusions:    PRN Medications:        Past Medical History  Diagnosis Date  . Coronary atherosclerosis of native coronary artery     BMS to mid circumflex 2010  . NSTEMI (non-ST elevated myocardial infarction)     2010 - had prior workup for hypercoagulable state that was negative  . Mixed hyperlipidemia     Past Surgical History  Procedure Laterality Date  . Tonsillectomy    . Shoulder surgery      History reviewed. No pertinent family history.  Social History [name] reports that she has never smoked. She does not have any smokeless tobacco history on file. [name] reports that she does not drink alcohol.  Review of Systems Otherwise reviewed and negative except as outlined.  Physical Examination Blood pressure 112/70, pulse 83, resp. rate 18, height 5\' 7"  (1.702 m), weight 162 lb (73.483 kg), last menstrual period 11/11/2013, SpO2 99.00%. @IOBRIEF @  Telemetry:  GEN: HEENT: Conjunctiva and lids normal, oropharynx clear with moist mucosa. Neck: Supple, no elevated JVP or carotid bruits, no thyromegaly. Lungs: Clear to auscultation, nonlabored breathing at rest. Cardiac: Regular rate and rhythm, no S3 or significant systolic murmur, no pericardial rub. Abdomen: Soft, nontender, no hepatomegaly, bowel sounds present, no guarding or rebound. Extremities: No pitting edema, distal pulses 2+. Skin: Warm and dry. Musculoskeletal: No kyphosis. Neuropsychiatric: Alert and oriented x3, affect grossly appropriate.  Prior Cardiac Testing/Procedures  Lab Results  Basic  Metabolic Panel: No results found for this basename: NA, K, CL, CO2, GLUCOSE, BUN, CREATININE, CALCIUM, MG, PHOS,  in the last 168 hours  Liver Function Tests: No results found for this basename: AST, ALT, ALKPHOS, BILITOT, PROT, ALBUMIN,  in the last 168 hours  CBC: No results found for this basename: WBC, NEUTROABS, HGB, HCT, MCV, PLT,  in the last 168 hours  Cardiac Enzymes: No results found for this basename: CKTOTAL, CKMB, CKMBINDEX, TROPONINI,  in the last 168 hours  BNP: No components found with this basename: POCBNP,    Radiology: No results found.   ECG:   Impression and Recommendations    Signed: Phill Myron. Purcell Nails NP [facility] 12/14/2013, 5:02 PM Co-Sign MD

## 2013-12-14 NOTE — Progress Notes (Signed)
Transported via [employer] awake and alert to [facility]

## 2013-12-15 ENCOUNTER — Other Ambulatory Visit: Payer: Self-pay

## 2013-12-15 ENCOUNTER — Encounter (HOSPITAL_COMMUNITY)
Admission: EM | Disposition: A | Discharge status: Home / Self Care | Payer: Self-pay | Source: Other Acute Inpatient Hospital | Attending: Cardiology

## 2013-12-15 DIAGNOSIS — R079 Chest pain, unspecified: Secondary | ICD-10-CM

## 2013-12-15 DIAGNOSIS — I2510 Atherosclerotic heart disease of native coronary artery without angina pectoris: Secondary | ICD-10-CM

## 2013-12-15 DIAGNOSIS — I200 Unstable angina: Secondary | ICD-10-CM

## 2013-12-15 HISTORY — PX: LEFT HEART CATHETERIZATION WITH CORONARY ANGIOGRAM: SHX5451

## 2013-12-15 LAB — BASIC METABOLIC PANEL
BUN: 14 mg/dL (ref 6–23)
CHLORIDE: 105 meq/L (ref 96–112)
CO2: 27 mEq/L (ref 19–32)
CREATININE: 0.59 mg/dL (ref 0.50–1.10)
Calcium: 9.5 mg/dL (ref 8.4–10.5)
GFR calc Af Amer: >90 mL/min (ref >90)
GFR calc non Af Amer: >90 mL/min (ref >90)
Glucose, Bld: 83 mg/dL (ref 70–99)
Potassium: 5 mEq/L (ref 3.7–5.3)
Sodium: 142 mEq/L (ref 137–147)

## 2013-12-15 LAB — PROTIME-INR
INR: 1.03 (ref 0.00–1.49)
INR: 1.04 (ref 0.00–1.49)
PROTHROMBIN TIME: 13.3 s (ref 11.6–15.2)
Prothrombin Time: 13.4 seconds (ref 11.6–15.2)

## 2013-12-15 LAB — LIPID PANEL
CHOL/HDL RATIO: 1.6 ratio
Cholesterol: 101 mg/dL (ref 0–200)
HDL: 62 mg/dL (ref >39)
LDL Cholesterol: 31 mg/dL (ref 0–99)
TRIGLYCERIDES: 40 mg/dL (ref <150)
VLDL: 8 mg/dL (ref 0–40)

## 2013-12-15 LAB — APTT: APTT: 28 s (ref 24–37)

## 2013-12-15 LAB — TSH: TSH: 1.763 u[IU]/mL (ref 0.350–4.500)

## 2013-12-15 LAB — PREGNANCY, URINE: Preg Test, Ur: NEGATIVE

## 2013-12-15 LAB — TROPONIN I: Troponin I: <0.3 ng/mL (ref <0.30)

## 2013-12-15 SURGERY — LEFT HEART CATHETERIZATION WITH CORONARY ANGIOGRAM
Anesthesia: LOCAL

## 2013-12-15 MED ORDER — HEPARIN SODIUM (PORCINE) 1000 UNIT/ML IJ SOLN
INTRAMUSCULAR | Status: AC
  Filled 2013-12-15: qty 1

## 2013-12-15 MED ORDER — HEPARIN (PORCINE) IN NACL 2-0.9 UNIT/ML-% IJ SOLN
INTRAMUSCULAR | Status: AC
  Filled 2013-12-15: qty 1000

## 2013-12-15 MED ORDER — MIDAZOLAM HCL 2 MG/2ML IJ SOLN
INTRAMUSCULAR | Status: AC
  Filled 2013-12-15: qty 2

## 2013-12-15 MED ORDER — SODIUM CHLORIDE 0.9 % IV SOLN
INTRAVENOUS | Status: DC
  Administered 2013-12-15: 21:00:00 via INTRAVENOUS

## 2013-12-15 MED ORDER — LORAZEPAM 2 MG/ML IJ SOLN
1.0000 mg | INTRAMUSCULAR | Status: DC | PRN
  Administered 2013-12-15: 1 mg via INTRAVENOUS
  Filled 2013-12-15: qty 1

## 2013-12-15 MED ORDER — VERAPAMIL HCL 2.5 MG/ML IV SOLN
INTRAVENOUS | Status: AC
  Filled 2013-12-15: qty 2

## 2013-12-15 MED ORDER — ASPIRIN EC 81 MG PO TBEC
81.0000 mg | DELAYED_RELEASE_TABLET | Freq: Every day | ORAL | Status: DC
  Filled 2013-12-15: qty 1

## 2013-12-15 MED ORDER — LIDOCAINE HCL (PF) 1 % IJ SOLN
INTRAMUSCULAR | Status: AC
  Filled 2013-12-15: qty 30

## 2013-12-15 MED ORDER — MIDAZOLAM HCL 2 MG/2ML IJ SOLN
INTRAMUSCULAR | Status: AC
Start: 2013-12-15 — End: 2013-12-15
  Filled 2013-12-15: qty 2

## 2013-12-15 MED ORDER — FENTANYL CITRATE 0.05 MG/ML IJ SOLN
INTRAMUSCULAR | Status: AC
  Filled 2013-12-15: qty 2

## 2013-12-15 MED ORDER — NITROGLYCERIN 0.2 MG/ML ON CALL CATH LAB
INTRAVENOUS | Status: AC
  Filled 2013-12-15: qty 1

## 2013-12-15 MED ORDER — MORPHINE SULFATE 2 MG/ML IJ SOLN
2.0000 mg | INTRAMUSCULAR | Status: DC | PRN
  Administered 2013-12-15 (×2): 2 mg via INTRAVENOUS
  Filled 2013-12-15 (×2): qty 1

## 2013-12-15 MED FILL — Medication: Qty: 1 | Status: AC

## 2013-12-15 NOTE — Interval H&P Note (Signed)
Cath Lab Visit (complete for each Cath Lab visit)  Clinical Evaluation Leading to the Procedure:   ACS: no  Non-ACS:    Anginal Classification: CCS IV  Anti-ischemic medical therapy: Minimal Therapy (1 class of medications)  Non-Invasive Test Results: No non-invasive testing performed  Prior CABG: No previous CABG      History and Physical Interval Note:  12/15/2013 7:24 PM  Erin Morris  has presented today for surgery, with the diagnosis of CP  The various methods of treatment have been discussed with the patient and family. After consideration of risks, benefits and other options for treatment, the patient has consented to  Procedure(s): LEFT HEART CATHETERIZATION WITH CORONARY ANGIOGRAM (N/A) as a surgical intervention .  The patient's history has been reviewed, patient examined, no change in status, stable for surgery.  I have reviewed the patient's chart and labs.  Questions were answered to the patient's satisfaction.     Erin Morris A

## 2013-12-15 NOTE — CV Procedure (Signed)
Erin Morris is a 28 y.o. female    [id number]  [id number] LOCATION:  FACILITY: [facility]  PHYSICIAN: Troy Sine, MD, [facility] 1986-08-01   DATE OF PROCEDURE:  12/15/2013    CARDIAC CATHETERIZATION     HISTORY: Ms. Gwenette Wellons is a 28 year old Caucasian female who suffered an acute coronary syndrome 5 years ago and was found to have total proximal left circumflex occlusion with thrombus which was successfully treated with insertion of a bare-metal stent by Dr. Lia Foyer. She was maintained on dual antiplatelet therapy for one year is continued on simvastatin aspirin. She is a radiology tech at any hospital. She developed symptoms of chest arm discomfort but some qualities that possibly were like her previous discomfort. She was [facility] where he was admitted to the CCU. She now presents for definitive repeat cardiac catheterization.   PROCEDURE:  A right radial approach was utilized after the patient had a positive Allen's test. Initially, Versed 2 mg and fentanyl 50 mcg were administered but due to continued anxiety she received an additional milligram of Versed and 25 mcg of fentanyl. A right wrist was prepped in sterile fashion. The right radial artery is punctured Seldinger technique and a 6 [place] Glidesheath slender sheath was inserted without difficulty. A radial cocktail consisting of verapamil, IV nitroglycerin, and lidocaine was administered. 4000 units of heparin were administered after the safety J. wire and JL4 not sick RCA catheter were inserted. Selective angiography was performed. A JL3.5 diagnostic catheter was used for selective angiography into the left coronary system. A 5 French pigtail catheter was used for left ventriculography. Catheters were removed. A TR band was applied at 20:00 with 14 cc of air.. she left the catheterization laboratory chest pain-free with stable hemodynamics.   HEMODYNAMICS:   Central Aorta: 86/51   Left Ventricle:  86/2  ANGIOGRAPHY:  The left main coronary artery was angiographically normal and bifurcated into the LAD and left circumflex vessel.  The left anterior descending artery was angiographically and extended to the LV apex.   The left circumflex was a large caliber vessel. This gave rise to a very high marginal branch. The proximal circumflex had a widely patent stent extended into the second marginal branch. The stent was widely patent without evidence for restenosis or any residual narrowing.  The RCA was angiographically normal it gave rise to the PDA and small posterolateral branch.  Left ventricular function was in the low-normal range. Visualization was suboptimal to truly delineate any wall motion abnormality definitively.   IMPRESSION:  Low normal LV function  Normal appearing coronary arteries with a widely patent bare-metal stent in the proximal circumflex extending into the OM 2 branch without restenosis.  RECOMMENDATION:  Suspect noncardiac discomfort in the  etiology of her chest pain. She is dry with an LVEDP of 2 and will aggressively hydrate post procedure.  Troy Sine, MD, [facility] 12/15/2013 8:11 PM

## 2013-12-15 NOTE — H&P (View-Only) (Signed)
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 This is mild, but reminiscent in location and quality with previous angina. Normal enzymes. No new ECG changes - sequelae of old infero-postero-lateral MI after LCX occlusion treated with bare metal stent. [facility] scientific 2.75 x 24 veriflex stent, 2010).  CURRENT MEDS . aspirin EC  81 mg Oral Daily  . enoxaparin (LOVENOX) injection  40 mg Subcutaneous Q24H  . metoprolol tartrate  12.5 mg Oral BID    OBJECTIVE  No intake or output data in the 24 hours ending 12/15/13 1124 Filed Weights   12/14/13 2000  Weight: 73.8 kg (162 lb 11.2 oz)    PHYSICAL EXAM Filed Vitals:   12/15/13 0608 12/15/13 0700 12/15/13 0726 12/15/13 0800  BP: 98/46 93/46  97/49  Pulse:   66   Temp:   97.8 F (36.6 C)   TempSrc:   Oral   Resp: 17 16  11   Height:      Weight:      SpO2:   100%    General: Alert, oriented x3, no distress Head: no evidence of trauma, PERRL, EOMI, no exophtalmos or lid lag, no myxedema, no xanthelasma; normal ears, nose and oropharynx Neck: normal jugular venous pulsations and no hepatojugular reflux; brisk carotid pulses without delay and no carotid bruits Chest: clear to auscultation, no signs of consolidation by percussion or palpation, normal fremitus, symmetrical and full respiratory excursions Cardiovascular: normal position and quality of the apical impulse, regular rhythm, normal first and second heart sounds, no rubs or gallops, no murmur Abdomen: no tenderness or distention, no masses by palpation, no abnormal pulsatility or arterial bruits, normal bowel sounds, no hepatosplenomegaly Extremities: no clubbing, cyanosis or edema; 2+ radial, ulnar and brachial pulses bilaterally; 2+ right femoral, posterior tibial and dorsalis pedis pulses; 2+ left  femoral, posterior tibial and dorsalis pedis pulses; no subclavian or femoral bruits Neurological: grossly nonfocal Favorable Allen's test R wrist  LABS  CBC  Recent Labs  12/14/13 2237  WBC 5.3  NEUTROABS 3.9  HGB 12.5  HCT 37.4  MCV 88.2  PLT 417   Basic Metabolic Panel  Recent Labs  12/14/13 2237  CREATININE 0.70   Liver Function Tests No results found for this basename: AST, ALT, ALKPHOS, BILITOT, PROT, ALBUMIN,  in the last 72 hours No results found for this basename: LIPASE, AMYLASE,  in the last 72 hours Cardiac Enzymes  Recent Labs  12/14/13 2237 12/15/13 0220 12/15/13 0839  TROPONINI <0.30 <0.30 <0.30   BNP No components found with this basename: POCBNP,  D-Dimer No results found for this basename: DDIMER,  in the last 72 hours Hemoglobin A1C No results found for this basename: HGBA1C,  in the last 72 hours Fasting Lipid Panel  Recent Labs  12/15/13 0220  CHOL 101  HDL 62  LDLCALC 31  TRIG 40  CHOLHDL 1.6   Thyroid Function Tests  Recent Labs  12/14/13 2237  TSH 1.763    Radiology Studies Imaging results have been reviewed and No results found.  TELE NSR  ECG NSR, small Q waves inferior and lateral leads, Tall R wave V2  ASSESSMENT AND PLAN Although the ECG and cardiac enzymes appear to be low risk she gives a compelling history suggestive of unstable angina. I think it is best to pursue a definitive angiographic approach.  Plan for radial approach coronary angio later today. Keep NPO.   Sanda Klein, MD, [facility] [phone] office [phone] pager 12/15/2013 11:24 AM

## 2013-12-15 NOTE — Progress Notes (Signed)
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 This is mild, but reminiscent in location and quality with previous angina. Normal enzymes. No new ECG changes - sequelae of old infero-postero-lateral MI after LCX occlusion treated with bare metal stent. [facility] scientific 2.75 x 24 veriflex stent, 2010).  CURRENT MEDS . aspirin EC  81 mg Oral Daily  . enoxaparin (LOVENOX) injection  40 mg Subcutaneous Q24H  . metoprolol tartrate  12.5 mg Oral BID    OBJECTIVE  No intake or output data in the 24 hours ending 12/15/13 1124 Filed Weights   12/14/13 2000  Weight: 73.8 kg (162 lb 11.2 oz)    PHYSICAL EXAM Filed Vitals:   12/15/13 0608 12/15/13 0700 12/15/13 0726 12/15/13 0800  BP: 98/46 93/46  97/49  Pulse:   66   Temp:   97.8 F (36.6 C)   TempSrc:   Oral   Resp: 17 16  11   Height:      Weight:      SpO2:   100%    General: Alert, oriented x3, no distress Head: no evidence of trauma, PERRL, EOMI, no exophtalmos or lid lag, no myxedema, no xanthelasma; normal ears, nose and oropharynx Neck: normal jugular venous pulsations and no hepatojugular reflux; brisk carotid pulses without delay and no carotid bruits Chest: clear to auscultation, no signs of consolidation by percussion or palpation, normal fremitus, symmetrical and full respiratory excursions Cardiovascular: normal position and quality of the apical impulse, regular rhythm, normal first and second heart sounds, no rubs or gallops, no murmur Abdomen: no tenderness or distention, no masses by palpation, no abnormal pulsatility or arterial bruits, normal bowel sounds, no hepatosplenomegaly Extremities: no clubbing, cyanosis or edema; 2+ radial, ulnar and brachial pulses bilaterally; 2+ right femoral, posterior tibial and dorsalis pedis pulses; 2+ left  femoral, posterior tibial and dorsalis pedis pulses; no subclavian or femoral bruits Neurological: grossly nonfocal Favorable Allen's test R wrist  LABS  CBC  Recent Labs  12/14/13 2237  WBC 5.3  NEUTROABS 3.9  HGB 12.5  HCT 37.4  MCV 88.2  PLT 417   Basic Metabolic Panel  Recent Labs  12/14/13 2237  CREATININE 0.70   Liver Function Tests No results found for this basename: AST, ALT, ALKPHOS, BILITOT, PROT, ALBUMIN,  in the last 72 hours No results found for this basename: LIPASE, AMYLASE,  in the last 72 hours Cardiac Enzymes  Recent Labs  12/14/13 2237 12/15/13 0220 12/15/13 0839  TROPONINI <0.30 <0.30 <0.30   BNP No components found with this basename: POCBNP,  D-Dimer No results found for this basename: DDIMER,  in the last 72 hours Hemoglobin A1C No results found for this basename: HGBA1C,  in the last 72 hours Fasting Lipid Panel  Recent Labs  12/15/13 0220  CHOL 101  HDL 62  LDLCALC 31  TRIG 40  CHOLHDL 1.6   Thyroid Function Tests  Recent Labs  12/14/13 2237  TSH 1.763    Radiology Studies Imaging results have been reviewed and No results found.  TELE NSR  ECG NSR, small Q waves inferior and lateral leads, Tall R wave V2  ASSESSMENT AND PLAN Although the ECG and cardiac enzymes appear to be low risk she gives a compelling history suggestive of unstable angina. I think it is best to pursue a definitive angiographic approach.  Plan for radial approach coronary angio later today. Keep NPO.   Sanda Klein, MD, [facility] [phone] office [phone] pager 12/15/2013 11:24 AM

## 2013-12-15 NOTE — Progress Notes (Signed)
Pt c/o of 6/10 chest pain radiating toward her shoulder and back. Nitro x1 given with no relief. BP decrease from 120/48 to 97/52 and 92/52. Subsequent Nitro not given. Oxygen applied. EKG obtained. No EKG changes assessed. On call MD paged. Morphine 2-4mg  IV Q3h PRN ordered. Morphine 2mg  given to patient. Pt stated pain is "a little bit better, but it's still there."  Now rating pain 5/10.

## 2013-12-15 NOTE — Progress Notes (Signed)
Pt c/o increased CP. Pt. Describes pain as 8-9/10 crushing pressuring radiating towards shoulders and left arm with an increase in nausea. Nitro x3 and Morphine 2mg  given, O2 increased to 4L with no relief. Repeat EKG performed. No changes assessed. MD notified. First two Troponins have been negative. MD to bedside. Ativan ordered.

## 2013-12-15 NOTE — Progress Notes (Signed)
UR Completed.  [place] 12/15/2013 Vergie Living

## 2013-12-16 NOTE — Discharge Instructions (Signed)
Call [facility] at [phone] if any bleeding, swelling or drainage at cath site.  May shower, no tub baths for 48 hours for groin sticks.   Heart Healthy diet  No work until [employer]. December 20, 2013 No driving for 3 days.  No lifting over 5 pounds for 3 days   Ok to use ibuprofen 200-400 mg every 6 hours as needed for pain, would take pepcid 20 mg daily if you use the ibuprofen.  All over the counter.

## 2013-12-16 NOTE — Discharge Summary (Signed)
Physician Discharge Summary       Patient ID: Erin Morris MRN: [id number] DOB/AGE: [birth year] 28 y.o.  Admit date: 12/14/2013 Discharge date: 12/16/2013  Discharge Diagnoses:  Principal Problem:   Coronary artery disease with chest and shoulder pain, initially thought to be angina, neg MI, stable cardiac cath, most likely cause MSK Active Problems:   Hyperlipidemia, treated   Coronary atherosclerosis of native coronary artery, stent to prox LCX into OM2 placed 2010, patent   Discharged Condition: good  Primary Cardiologist Dr. McDowell/ previously Dr. Lia Foyer  Procedures: cardiac cath 12/15/13 by Dr. Claiborne Billings.  Hospital Course: 28 y.o.female with known history of premature coronary artery disease, with myocardial infarction at age 7 requiring cardiac catheterization and bare-metal stent to the circumflex artery by Dr. Addison Lank in 2010. The patient was on dual antiplatelet therapy for one year, she is continued on simvastatin, and aspirin now.   The patient was in her usual state of health without any difficulties until around 3:30 AM when she began to have right sided chest pressure and pain with sharp pain radiating through her clavicle to her scapula. This pain was very similar to what she experienced with her first myocardial infarction in 2010. The only difference was she had some associated shortness of breath and the pain was in her bilateral shoulders at that time.   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 She works the 4 pieces 3:30 shift at [employer], and states approximately 3 days later she had a heavy patient greater than 400 pounds she was pushing around in a wheelchair. She did not have any muscle aches or pains 2 days afterwards. However last night as she was getting off work she noticed a pressure in her shoulder and chest. The pain is coming going lasting several minutes with recurrent sharpness radiating  through to her right scapula.  NTG in office with some relief of pain.  Transferred to [facility] with plans to rule out MI and proceed with Echo stress test.  Due to continued pain the next day it was decided to proceed with cardiac cath.  Cardiac enzymes were negative.  Cardiac cath by Dr. Claiborne Billings:  Low normal LV function  Normal appearing coronary arteries with a widely patent bare-metal stent in the proximal circumflex extending into the OM 2 branch without restenosis.   By the next AM she was stable without pain and found by Dr. Marlou Porch to be ready for discharge.  She will be out of work until [employer]. The 1st of April.  The chest/shoulder pain most likely MSK, ok to use ibuprofen/pepcid.  She will continue ASA and statin.She will follow up with Dr. Domenic Polite.     Consults: None  Significant Diagnostic Studies:  BMET    Component Value Date/Time   NA 142 12/15/2013 1447   K 5.0 12/15/2013 1447   CL 105 12/15/2013 1447   CO2 27 12/15/2013 1447   GLUCOSE 83 12/15/2013 1447   BUN 14 12/15/2013 1447   CREATININE 0.59 12/15/2013 1447   CALCIUM 9.5 12/15/2013 1447   GFRNONAA >90 12/15/2013 1447   GFRAA >90 12/15/2013 1447    CBC    Component Value Date/Time   WBC 5.3 12/14/2013 2237   WBC 3.2* 05/16/2009 0920   RBC 4.24 12/14/2013 2237   RBC 3.91 05/16/2009 0920   HGB 12.5 12/14/2013 2237   HGB 11.5* 05/16/2009 0920   HCT 37.4 12/14/2013 2237  HCT 34.2* 05/16/2009 0920   PLT 157 12/14/2013 2237   PLT 149 05/16/2009 0920   MCV 88.2 12/14/2013 2237   MCV 87.4 05/16/2009 0920   MCH 29.5 12/14/2013 2237   MCH 29.3 05/16/2009 0920   MCHC 33.4 12/14/2013 2237   MCHC 33.6 05/16/2009 0920   RDW 14.2 12/14/2013 2237   RDW 15.0* 05/16/2009 0920   LYMPHSABS 1.2 12/14/2013 2237   LYMPHSABS 1.2 05/16/2009 0920   MONOABS 0.2 12/14/2013 2237   MONOABS 0.3 05/16/2009 0920   EOSABS 0.1 12/14/2013 2237   EOSABS 0.1 05/16/2009 0920   BASOSABS 0.0 12/14/2013 2237   BASOSABS 0.0 05/16/2009 0920   Troponin I neg X 3   T chol  101; TG 40;HDL 62;LDL 31   TSH 1.763 Neg preg test  Discharge Exam: Blood pressure 109/62, pulse 74, temperature 97.9 F (36.6 C), temperature source Oral, resp. rate 11, height 5\' 7"  (1.702 m), weight 162 lb 11.2 oz (73.8 kg), last menstrual period 11/11/2013, SpO2 98.00%.  Disposition: Home     Medication List         aspirin 81 MG EC tablet  Take 81 mg by mouth daily.     cycloSPORINE 0.05 % ophthalmic emulsion  Commonly known as:  RESTASIS  1 drop daily.     simvastatin 5 MG tablet  Commonly known as:  ZOCOR  Take 1 tablet (5 mg total) by mouth at bedtime.       Follow-up Information   Follow up with [name], MD. (our office should call you with date and time of appt. if you have not heard by Tuesday call the office.)    Specialty:  Cardiology   Contact information:   [place] [id number] [phone]        Discharge Instructions: Call [facility] at [phone] if any bleeding, swelling or drainage at cath site.  May shower, no tub baths for 48 hours for groin sticks.   Heart Healthy diet  No work until [employer]. December 20, 2013 No driving for 3 days.  No lifting over 5 pounds for 3 days Ok to use ibuprofen 200-400 mg every 6 hours as needed for pain, would take pepcid 20 mg daily if you use the ibuprofen.  All over the counter.  Signed: Isaiah Serge Nurse Practitioner-Certified [facility]: [facility] 12/16/2013, 8:34 AM  Time spent on discharge :35 minutes.

## 2013-12-16 NOTE — Progress Notes (Addendum)
    Subjective:  OK to DC home.  Cath yesterday. BMS OM patent. No other disease.  No CP.   Objective:  Vital Signs in the last 24 hours: Temp:  [97.9 F (36.6 C)-98.6 F (37 C)] 98.5 F (36.9 C) (03/28 0344) Pulse Rate:  [57-82] 82 (03/27 1942) Resp:  [11-25] 16 (03/28 0600) BP: (84-104)/(41-58) 95/56 mmHg (03/28 0600) SpO2:  [96 %-100 %] 97 % (03/28 0600)  Intake/Output from previous day: 03/27 0701 - 03/28 0700 In: 2455 [P.O.:1080; I.V.:1375] Out: 1 [Urine:1]   Physical Exam: General: Well developed, well nourished, in no acute distress. Head:  Normocephalic and atraumatic. Lungs: Clear to auscultation and percussion. Heart: Normal S1 and S2.  No murmur, rubs or gallops.  Abdomen: soft, non-tender, positive bowel sounds. Extremities: No clubbing or cyanosis. No edema. Radial site c/d/i. Neurologic: Alert and oriented x 3.    Lab Results:  Recent Labs  12/14/13 2237  WBC 5.3  HGB 12.5  PLT 157    Recent Labs  12/14/13 2237 12/15/13 1447  NA  --  142  K  --  5.0  CL  --  105  CO2  --  27  GLUCOSE  --  83  BUN  --  14  CREATININE 0.70 0.59    Recent Labs  12/15/13 0220 12/15/13 0839  TROPONINI <0.30 <0.30     Recent Labs  12/15/13 0220  CHOL 101   Telemetry: Sbrady - no adverse rhythm Personally viewed.  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   Follow up with Dr. Domenic Polite. 1-2 weeks  See DC note for details.    SKAINS, [place] 12/16/2013, 7:36 AM

## 2013-12-17 NOTE — Discharge Summary (Signed)
Personally seen and examined. Agree with above. Please see progress note for details.  

## 2013-12-19 ENCOUNTER — Encounter: Payer: 59 | Admitting: Adult Health

## 2013-12-19 NOTE — Progress Notes (Signed)
    HPI: Miss Erin Morris is a 28 year old patient of Dr. Domenic Polite, we are seeing posthospitalization after admission 5 days ago with complaints of recurrent chest pain in the setting of premature coronary artery disease with myocardial infarction at age 49 requiring cardiac catheterization at that time with bare-metal stent to the circumflex artery by Dr. Addison Lank on 2010.  The patient was transferred to [facility] in the setting from our office with recurrent chest pain similar to that which occurred during her initial MI. Cardiac catheterization was completed by Dr. Claiborne Billings, revealing normal-appearing coronary arteries with a widely patent bare-metal stent in the proximal circumflex extending into the L1 to branch without restenosis. She was found to have musculoskeletal pain ibuprofen and Pepcid, and continue aspirin statin. He is here on hospital followup.  No Known Allergies  Current Outpatient Prescriptions  Medication Sig Dispense Refill  . aspirin 81 MG EC tablet Take 81 mg by mouth daily.        . cycloSPORINE (RESTASIS) 0.05 % ophthalmic emulsion 1 drop daily.      . simvastatin (ZOCOR) 5 MG tablet Take 1 tablet (5 mg total) by mouth at bedtime.  90 tablet  3   No current facility-administered medications for this visit.    Past Medical History  Diagnosis Date  . Coronary atherosclerosis of native coronary artery     BMS to mid circumflex 2010  . NSTEMI (non-ST elevated myocardial infarction)     2010 - had prior workup for hypercoagulable state that was negative  . Mixed hyperlipidemia     Past Surgical History  Procedure Laterality Date  . Tonsillectomy    . Shoulder surgery      ROS: PHYSICAL EXAM LMP 11/11/2013  EKG:  ASSESSMENT AND PLAN

## 2013-12-20 ENCOUNTER — Encounter: Payer: Self-pay | Admitting: Cardiovascular Disease

## 2013-12-25 ENCOUNTER — Encounter: Payer: Self-pay | Admitting: Cardiology

## 2013-12-25 ENCOUNTER — Ambulatory Visit (INDEPENDENT_AMBULATORY_CARE_PROVIDER_SITE_OTHER): Payer: 59 | Admitting: Cardiology

## 2013-12-25 VITALS — BP 110/69 | HR 64 | Ht 67.0 in | Wt 165.0 lb

## 2013-12-25 DIAGNOSIS — I2510 Atherosclerotic heart disease of native coronary artery without angina pectoris: Secondary | ICD-10-CM

## 2013-12-25 DIAGNOSIS — E782 Mixed hyperlipidemia: Secondary | ICD-10-CM

## 2013-12-25 NOTE — Patient Instructions (Signed)
Continue all current medications. Your physician wants you to follow up in: 6 months.  You will receive a reminder letter in the mail one-two months in advance.  If you don't receive a letter, please call our office to schedule the follow up appointment   

## 2013-12-25 NOTE — Progress Notes (Signed)
    Clinical Summary [name] is a 28 y.o.female presenting for a post hospital followup. I met her in the office back in July 2014. Recent records reviewed including office visit with Ms. Lawrence NP in late March, subsequent hospitalization at [facility] due to right sided chest and shoulder discomfort, somewhat similar to prior angina, although also suspicion for muscle strain/musculoskeletal etiology. Fortunately, she ruled out for myocardial infarction and underwent cardiac catheterization which demonstrated widely patent BMS in the circumflex extending into the OM 2, otherwise normal appearing coronary arteries, low normal LVEF.  Recent lab work reviewed finding cholesterol 101, triglycerides 40, HDL 62, LDL 31.  She presents today stating that she has been doing well, no recurrent chest pain or shoulder pain symptoms. She has returned to work already. 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  She continues to take her medicines regularly, also exercising usually 5 days a week.   No Known Allergies  Current Outpatient Prescriptions  Medication Sig Dispense Refill  . aspirin 81 MG EC tablet Take 81 mg by mouth daily.        . cycloSPORINE (RESTASIS) 0.05 % ophthalmic emulsion 1 drop daily.      . nitroGLYCERIN (NITROSTAT) 0.4 MG SL tablet Place 0.4 mg under the tongue every 5 (five) minutes as needed for chest pain.      . simvastatin (ZOCOR) 5 MG tablet Take 1 tablet (5 mg total) by mouth at bedtime.  90 tablet  3   No current facility-administered medications for this visit.    Past Medical History  Diagnosis Date  . Coronary atherosclerosis of native coronary artery     BMS to mid circumflex 2010  . NSTEMI (non-ST elevated myocardial infarction)     2010 - had prior workup for hypercoagulable state that was negative  . Mixed hyperlipidemia     Social History [name] reports that she has never smoked. She does not have any  smokeless tobacco history on file. [name] reports that she does not drink alcohol.  Review of Systems Negative except as outlined.  Physical Examination Filed Vitals:   12/25/13 1055  BP: 110/69  Pulse: 64   Filed Weights   12/25/13 1055  Weight: 165 lb (74.844 kg)    Normally nourished appearing. Comfortable at rest.  HEENT: Conjunctiva and lids normal, wearing glasses.   Extremities: No edema evident. Right radial catheterization site healing well. Skin: Warm and dry.  Musculoskeletal: No kyphosis.  Neuropsychiatric: Alert and oriented x3, affect grossly appropriate.   Problem List and Plan   Coronary atherosclerosis of native coronary artery Status post recent cardiac catheterization demonstrating widely patent BMS in the circumflex and otherwise normal-appearing coronary arteries, low normal LVEF. No evidence of ACS with recent hospitalization. Blood pressure and heart rate look good today. She is on a reasonable medical regimen, continues to exercise regularly. Continue risk factor modification, followup within the next 6 months.  Mixed hyperlipidemia Lipids well-controlled, continues on Zocor.    Satira Sark, M.D., F.A.C.C.

## 2013-12-25 NOTE — Assessment & Plan Note (Signed)
Lipids well-controlled, continues on Zocor.

## 2013-12-25 NOTE — Assessment & Plan Note (Signed)
Status post recent cardiac catheterization demonstrating widely patent BMS in the circumflex and otherwise normal-appearing coronary arteries, low normal LVEF. No evidence of ACS with recent hospitalization. Blood pressure and heart rate look good today. She is on a reasonable medical regimen, continues to exercise regularly. Continue risk factor modification, followup within the next 6 months.

## 2014-06-25 ENCOUNTER — Ambulatory Visit: Payer: 59 | Admitting: Cardiology

## 2014-07-02 ENCOUNTER — Encounter: Payer: Self-pay | Admitting: Cardiology

## 2014-07-09 ENCOUNTER — Encounter: Payer: Self-pay | Admitting: Family Medicine

## 2014-07-09 ENCOUNTER — Ambulatory Visit (INDEPENDENT_AMBULATORY_CARE_PROVIDER_SITE_OTHER): Payer: 59 | Admitting: Family Medicine

## 2014-07-09 VITALS — BP 118/80 | Ht 67.0 in | Wt 175.0 lb

## 2014-07-09 DIAGNOSIS — N3000 Acute cystitis without hematuria: Secondary | ICD-10-CM

## 2014-07-09 DIAGNOSIS — R350 Frequency of micturition: Secondary | ICD-10-CM

## 2014-07-09 LAB — POCT URINALYSIS DIPSTICK
Spec Grav, UA: <=1.005
pH, UA: 7

## 2014-07-09 MED ORDER — CIPROFLOXACIN HCL 500 MG PO TABS: 500.0000 mg | ORAL_TABLET | Freq: Two times a day (BID) | ORAL | Status: AC

## 2014-07-09 NOTE — Progress Notes (Signed)
   Subjective:    Patient ID: Erin Morris, female    DOB: 12-25-1985, 28 y.o.   MRN: [id number]  HPIFrequent urination, low back pain, abd pain on left side. Started last Thursday.   incr freq  No dysuria   occas nocturia Neg nausea  No hx of utis  Last wk had viral prodrome  This has been constant discomfort this weekend  Movement occasional affectd the discomfort  At times cat hes   Took tylenol, and pt has hx of stent and tries to avoid a prost  Review of Systems No headache no chest pain no rash no change in bowel habits    Objective:   Physical Exam Alert no acute distress. Lungs clear. Heart rare rhythm. H&T normal. No true CVA tenderness. Mild left lower caudal tenderness. Excellent bowel sounds no rebound no guarding  Urinalysis 8-10 white blood cells per high-power field     Assessment & Plan:  Impression urinary tract infection plan Cipro 500 twice a day 7 days. Symptomatic care discussed. Tylenol when necessary. WSL

## 2014-08-06 ENCOUNTER — Ambulatory Visit: Payer: 59 | Admitting: Cardiology

## 2014-08-07 ENCOUNTER — Ambulatory Visit (INDEPENDENT_AMBULATORY_CARE_PROVIDER_SITE_OTHER): Payer: 59 | Admitting: Cardiology

## 2014-08-07 ENCOUNTER — Encounter: Payer: Self-pay | Admitting: Cardiology

## 2014-08-07 VITALS — BP 124/68 | HR 78 | Ht 67.0 in | Wt 177.0 lb

## 2014-08-07 DIAGNOSIS — I2510 Atherosclerotic heart disease of native coronary artery without angina pectoris: Secondary | ICD-10-CM

## 2014-08-07 DIAGNOSIS — E782 Mixed hyperlipidemia: Secondary | ICD-10-CM

## 2014-08-07 NOTE — Patient Instructions (Addendum)
Your physician wants you to follow-up in: 6 months You will receive a reminder letter in the mail two months in advance. If you don't receive a letter, please call our office to schedule the follow-up appointment.   Your physician recommends that you continue on your current medications as directed. Please refer to the Current Medication list given to you today.     Please get FASTING lab work (LIPID,LFT's) just before NEXT visit      Thank you for choosing [facility] !

## 2014-08-07 NOTE — Progress Notes (Signed)
   Reason for visit: CAD, hyperlipidemia  Clinical Summary Ms. [name] is a 28 y.o.female last seen in April. 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 She has had no angina symptoms, no nitroglycerin use. She also continues to exercise regularly without limitations.  Cardiac catheterization in March of this year revealed widely patent BMS in the circumflex extending into the OM 2, otherwise normal appearing coronary arteries with low normal LVEF. I reviewed the report today.  Lipid panel in March showed cholesterol 101, triglycerides 40, HDL 62, and LDL 31. She continues on very low-dose statin therapy for plaque stabilization and anti-inflammatory properties, her lipids have never been substantially elevated.  No Known Allergies  Current Outpatient Prescriptions  Medication Sig Dispense Refill  . aspirin 81 MG EC tablet Take 81 mg by mouth daily.      . nitroGLYCERIN (NITROSTAT) 0.4 MG SL tablet Place 0.4 mg under the tongue every 5 (five) minutes as needed for chest pain.    . simvastatin (ZOCOR) 5 MG tablet Take 1 tablet (5 mg total) by mouth at bedtime. 90 tablet 3   No current facility-administered medications for this visit.    Past Medical History  Diagnosis Date  . Coronary atherosclerosis of native coronary artery     BMS to mid circumflex 2010  . NSTEMI (non-ST elevated myocardial infarction)     2010 - had prior workup for hypercoagulable state that was negative  . Mixed hyperlipidemia     Social History Ms. [name] reports that she has never smoked. She does not have any smokeless tobacco history on file. Ms. [name] reports that she does not drink alcohol.  Review of Systems Complete review of systems negative except as otherwise outlined in the clinical summary and also the following.no palpitations or syncope. No bleeding problems. Stable appetite.  Physical Examination Filed Vitals:   08/07/14 1527  BP: 124/68  Pulse: 78    Filed Weights   08/07/14 1527  Weight: 177 lb (80.287 kg)   Young woman, appears comfortable at rest. HEENT: Conjunctiva and lids normal, oropharynx clear. Neck: Supple, no elevated JVP or carotid bruits, no thyromegaly. Lungs: Clear to auscultation, nonlabored breathing at rest. Cardiac: Regular rate and rhythm, no S3 or significant systolic murmur, no pericardial rub. Extremities: No pitting edema, distal pulses 2+.   Problem List and Plan   Coronary atherosclerosis of native coronary artery Symptomatically stable, continue aspirin and low-dose statin. Widely patent BMS in the circumflex documented in March of this year, otherwise normal-appearing LAD and RCA. Continue regular exercise and diet.  Mixed hyperlipidemia Continue Zocor 5 mg daily. Follow-up FLP and LFTs for next visit in 6 months.    Satira Sark, M.D., F.A.C.C.

## 2014-08-07 NOTE — Assessment & Plan Note (Signed)
Continue Zocor 5 mg daily. Follow-up FLP and LFTs for next visit in 6 months.

## 2014-08-07 NOTE — Progress Notes (Deleted)
Name: Erin Morris    DOB: 1985-12-06  Age: 28 y.o.  MR#: [id number]       PCP:  Rubbie Battiest, MD      Insurance: Payor: [employer] / Plan: [employer] / Product Type: *No Product type* /   CC:    Chief Complaint  Patient presents with  . Coronary Artery Disease  . Hyperlipidemia    VS Filed Vitals:   08/07/14 1527  BP: 124/68  Pulse: 78  Height: 5' 7"  (1.702 m)  Weight: 177 lb (80.287 kg)  SpO2: 98%    Weights Current Weight  08/07/14 177 lb (80.287 kg)  07/09/14 175 lb (79.379 kg)  12/25/13 165 lb (74.844 kg)    Blood Pressure  BP Readings from Last 3 Encounters:  08/07/14 124/68  07/09/14 118/80  12/25/13 110/69     Admit date:  (Not on file) Last encounter with RMR:  Visit date not found   Allergy Review of patient's allergies indicates no known allergies.  Current Outpatient Prescriptions  Medication Sig Dispense Refill  . aspirin 81 MG EC tablet Take 81 mg by mouth daily.      . nitroGLYCERIN (NITROSTAT) 0.4 MG SL tablet Place 0.4 mg under the tongue every 5 (five) minutes as needed for chest pain.    . simvastatin (ZOCOR) 5 MG tablet Take 1 tablet (5 mg total) by mouth at bedtime. 90 tablet 3   No current facility-administered medications for this visit.    Discontinued Meds:   There are no discontinued medications.  Patient Active Problem List   Diagnosis Date Noted  . Mixed hyperlipidemia 05/14/2009  . Coronary atherosclerosis of native coronary artery 04/16/2009    LABS    Component Value Date/Time   NA 142 12/15/2013 1447   NA 141 05/16/2009 1033   NA 140 04/29/2009 2233   K 5.0 12/15/2013 1447   K 3.7 05/16/2009 1033   K 4.8 04/29/2009 2233   CL 105 12/15/2013 1447   CL 109 05/16/2009 1033   CL 107 04/29/2009 2233   CO2 27 12/15/2013 1447   CO2 19 05/16/2009 1033   CO2 24 04/29/2009 2233   GLUCOSE 83 12/15/2013 1447   GLUCOSE 91 05/16/2009 1033   GLUCOSE 86 04/29/2009 2233   BUN 14 12/15/2013 1447   BUN 10 05/16/2009  1033   BUN 14 04/29/2009 2233   CREATININE 0.59 12/15/2013 1447   CREATININE 0.70 12/14/2013 2237   CREATININE 0.57 05/16/2009 1033   CALCIUM 9.5 12/15/2013 1447   CALCIUM 8.8 05/16/2009 1033   CALCIUM 9.3 04/29/2009 2233   GFRNONAA >90 12/15/2013 1447   GFRNONAA >90 12/14/2013 2237   GFRNONAA 94.24 04/25/2009 0959   GFRAA >90 12/15/2013 1447   GFRAA >90 12/14/2013 2237   GFRAA  04/10/2009 0415    >60        The eGFR has been calculated using the MDRD equation. This calculation has not been validated in all clinical situations. eGFR's persistently <60 mL/min signify possible Chronic Kidney Disease.   CMP     Component Value Date/Time   NA 142 12/15/2013 1447   K 5.0 12/15/2013 1447   CL 105 12/15/2013 1447   CO2 27 12/15/2013 1447   GLUCOSE 83 12/15/2013 1447   BUN 14 12/15/2013 1447   CREATININE 0.59 12/15/2013 1447   CALCIUM 9.5 12/15/2013 1447   PROT 6.5 10/23/2013 1444   ALBUMIN 4.2 10/23/2013 1444   AST 24 10/23/2013 1444   ALT  18 10/23/2013 1444   ALKPHOS 48 10/23/2013 1444   BILITOT 0.7 10/23/2013 1444   GFRNONAA >90 12/15/2013 1447   GFRAA >90 12/15/2013 1447       Component Value Date/Time   WBC 5.3 12/14/2013 2237   WBC 4.2 06/28/2009 2057   WBC 3.2* 05/16/2009 0920   WBC 8.1 04/12/2009 0500   HGB 12.5 12/14/2013 2237   HGB 11.6* 06/28/2009 2057   HGB 11.5* 05/16/2009 0920   HGB 12.3 04/12/2009 0500   HCT 37.4 12/14/2013 2237   HCT 37.9 06/28/2009 2057   HCT 34.2* 05/16/2009 0920   HCT 36.0 04/12/2009 0500   MCV 88.2 12/14/2013 2237   MCV 87.7 06/28/2009 2057   MCV 87.4 05/16/2009 0920   MCV 88.3 04/12/2009 0500    Lipid Panel     Component Value Date/Time   CHOL 101 12/15/2013 0220   TRIG 40 12/15/2013 0220   HDL 62 12/15/2013 0220   CHOLHDL 1.6 12/15/2013 0220   VLDL 8 12/15/2013 0220   LDLCALC 31 12/15/2013 0220    ABG No results found for: PHART, PCO2ART, PO2ART, HCO3, TCO2, ACIDBASEDEF, O2SAT   Lab Results  Component Value  Date   TSH 1.763 12/14/2013   BNP (last 3 results) No results for input(s): PROBNP in the last 8760 hours. Cardiac Panel (last 3 results) No results for input(s): CKTOTAL, CKMB, TROPONINI, RELINDX in the last 72 hours.  Iron/TIBC/Ferritin/ %Sat No results found for: IRON, TIBC, FERRITIN, IRONPCTSAT   EKG Orders placed or performed during the hospital encounter of 12/14/13  . EKG 12-Lead  . EKG 12-Lead  . EKG     Prior Assessment and Plan Problem List as of 08/07/2014      Cardiovascular and Mediastinum   Coronary atherosclerosis of native coronary artery   Last Assessment & Plan   12/25/2013 Office Visit Written 12/25/2013 11:06 AM by Satira Sark, MD    Status post recent cardiac catheterization demonstrating widely patent BMS in the circumflex and otherwise normal-appearing coronary arteries, low normal LVEF. No evidence of ACS with recent hospitalization. Blood pressure and heart rate look good today. She is on a reasonable medical regimen, continues to exercise regularly. Continue risk factor modification, followup within the next 6 months.      Other   Mixed hyperlipidemia   Last Assessment & Plan   12/25/2013 Office Visit Written 12/25/2013 11:07 AM by Satira Sark, MD    Lipids well-controlled, continues on Zocor.        Imaging: No results found.

## 2014-08-07 NOTE — Assessment & Plan Note (Signed)
Symptomatically stable, continue aspirin and low-dose statin. Widely patent BMS in the circumflex documented in March of this year, otherwise normal-appearing LAD and RCA. Continue regular exercise and diet.

## 2014-08-13 ENCOUNTER — Other Ambulatory Visit: Payer: Self-pay | Admitting: Cardiology

## 2014-08-13 MED ORDER — SIMVASTATIN 5 MG PO TABS: 5.0000 mg | ORAL_TABLET | Freq: Every day | ORAL | Status: DC

## 2014-08-13 NOTE — Telephone Encounter (Signed)
Needs refill on Simvastatin / tgs

## 2014-08-30 ENCOUNTER — Encounter (HOSPITAL_COMMUNITY): Payer: Self-pay | Admitting: Cardiovascular Disease

## 2015-02-02 LAB — HEPATIC FUNCTION PANEL
ALBUMIN: 4 g/dL (ref 3.5–5.2)
ALT: 14 U/L (ref 0–35)
AST: 23 U/L (ref 0–37)
Alkaline Phosphatase: 40 U/L (ref 39–117)
Bilirubin, Direct: 0.1 mg/dL (ref 0.0–0.3)
Indirect Bilirubin: 0.3 mg/dL (ref 0.2–1.2)
TOTAL PROTEIN: 6.5 g/dL (ref 6.0–8.3)
Total Bilirubin: 0.4 mg/dL (ref 0.2–1.2)

## 2015-02-02 LAB — LIPID PANEL
CHOLESTEROL: 85 mg/dL (ref 0–200)
HDL: 57 mg/dL (ref >=46)
LDL CALC: 22 mg/dL (ref 0–99)
TRIGLYCERIDES: 32 mg/dL (ref <150)
Total CHOL/HDL Ratio: 1.5 Ratio
VLDL: 6 mg/dL (ref 0–40)

## 2015-02-04 ENCOUNTER — Encounter: Payer: Self-pay | Admitting: Cardiology

## 2015-02-04 ENCOUNTER — Ambulatory Visit (INDEPENDENT_AMBULATORY_CARE_PROVIDER_SITE_OTHER): Payer: 59 | Admitting: Cardiology

## 2015-02-04 VITALS — BP 110/70 | HR 74 | Ht 67.0 in | Wt 184.0 lb

## 2015-02-04 DIAGNOSIS — I2510 Atherosclerotic heart disease of native coronary artery without angina pectoris: Secondary | ICD-10-CM

## 2015-02-04 DIAGNOSIS — E782 Mixed hyperlipidemia: Secondary | ICD-10-CM | POA: Diagnosis not present

## 2015-02-04 DIAGNOSIS — I25700 Atherosclerosis of coronary artery bypass graft(s), unspecified, with unstable angina pectoris: Secondary | ICD-10-CM

## 2015-02-04 NOTE — Patient Instructions (Signed)
Your physician wants you to follow-up in: 6 months November 2016 with Dr Ferne Reus will receive a reminder letter in the mail two months in advance. If you don't receive a letter, please call our office to schedule the follow-up appointment.     Your physician recommends that you continue on your current medications as directed. Please refer to the Current Medication list given to you today.     Thank you for choosing [facility] !

## 2015-02-04 NOTE — Progress Notes (Signed)
Cardiology Office Note  Date: 02/04/2015   ID: JAKIRA MCFADDEN, DOB [birth year], MRN [id number]  PCP: Mickie Hillier, MD  Primary Cardiologist: [name], MD   Chief Complaint  Patient presents with  . Coronary Artery Disease    History of Present Illness: Erin Morris is a 29 y.o. female last seen in November 2015. She presents for a routine visit, overall has been doing very well without any angina symptoms. She exercises most every day of the week for at least 30 minutes, has been running and also working out with weights. 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  I reviewed her recent lipid numbers outlined below. She has very low cholesterol numbers on low-dose Zocor, HDL is in good range. We went over her lipids over the years including back to her original cardiac event in 2010. She did have a hypercoagulable workup per Dr. Lia Foyer, also had elevated CRP. We have kept her on low-dose statin for plaque stabilization.  Cardiac catheterization in March 2015 revealed widely patent BMS in the circumflex extending into the OM 2, otherwise normal appearing coronary arteries with low normal LVEF.  ECG today is normal.  Past Medical History  Diagnosis Date  . Coronary atherosclerosis of native coronary artery     BMS to mid circumflex 2010  . NSTEMI (non-ST elevated myocardial infarction)     2010 - had prior workup for hypercoagulable state that was negative  . Mixed hyperlipidemia     Current Outpatient Prescriptions  Medication Sig Dispense Refill  . aspirin 81 MG EC tablet Take 81 mg by mouth daily.      Marland Kitchen loratadine (CLARITIN) 10 MG tablet Take 10 mg by mouth daily.    . nitroGLYCERIN (NITROSTAT) 0.4 MG SL tablet Place 0.4 mg under the tongue every 5 (five) minutes as needed for chest pain.    . simvastatin (ZOCOR) 5 MG tablet Take 1 tablet (5 mg total) by mouth at bedtime. 90 tablet 3   No current facility-administered medications for this  visit.    Allergies:  Review of patient's allergies indicates no known allergies.   Social History: The patient  reports that she has never smoked. She does not have any smokeless tobacco history on file. She reports that she does not drink alcohol or use illicit drugs.   ROS:  Please see the history of present illness. Otherwise, complete review of systems is positive for none.  All other systems are reviewed and negative.   Physical Exam: VS:  BP 110/70 mmHg  Pulse 74  Ht 5\' 7"  (1.702 m)  Wt 184 lb (83.462 kg)  BMI 28.81 kg/m2  SpO2 99%  LMP 01/08/2015, BMI Body mass index is 28.81 kg/(m^2).  Wt Readings from Last 3 Encounters:  02/04/15 184 lb (83.462 kg)  08/07/14 177 lb (80.287 kg)  07/09/14 175 lb (79.379 kg)     Appears comfortable at rest. HEENT: Conjunctiva and lids normal, oropharynx clear. Neck: Supple, no elevated JVP or carotid bruits, no thyromegaly. Lungs: Clear to auscultation, nonlabored breathing at rest. Cardiac: Regular rate and rhythm, no S3 or significant systolic murmur, no pericardial rub. Extremities: No pitting edema, distal pulses 2+.   ECG: ECG is ordered today and shows normal sinus rhythm.  Recent Labwork: 01/31/2015: ALT 14; AST 23     Component Value Date/Time   CHOL 85 01/31/2015 1935   TRIG 32 01/31/2015 1935   HDL 57 01/31/2015 1935   CHOLHDL 1.5 01/31/2015  1935   VLDL 6 01/31/2015 1935   [place] 22 01/31/2015 1935    ASSESSMENT AND PLAN:  1. Symptomatically stable with history of CAD status post BMS to the circumflex in 2010. Angiography from last year was reassuring. ECG today is normal. I have recommended that she continue her regular exercise plan, otherwise be generally observant for any change in stamina or new symptoms. Follow-up arranged.  2. Continues on statin therapy for plaque stabilization, very low LDL, HDL in good range.  Current medicines were reviewed at length with the patient today.   Orders Placed This  Encounter  Procedures  . EKG 12-Lead    Disposition: FU with me in 6 months.   Signed, Satira Sark, MD, [facility] 02/04/2015 3:14 PM    [place] at [facility] [address] [place], [place] [address] Phone: [phone]; Fax: [phone]

## 2015-08-07 ENCOUNTER — Ambulatory Visit: Payer: 59 | Admitting: Cardiology

## 2015-08-30 ENCOUNTER — Ambulatory Visit (INDEPENDENT_AMBULATORY_CARE_PROVIDER_SITE_OTHER): Payer: 59 | Admitting: Cardiology

## 2015-08-30 ENCOUNTER — Encounter: Payer: Self-pay | Admitting: Cardiology

## 2015-08-30 VITALS — BP 102/58 | HR 76 | Ht 67.0 in | Wt 192.0 lb

## 2015-08-30 DIAGNOSIS — I2510 Atherosclerotic heart disease of native coronary artery without angina pectoris: Secondary | ICD-10-CM

## 2015-08-30 DIAGNOSIS — E782 Mixed hyperlipidemia: Secondary | ICD-10-CM

## 2015-08-30 MED ORDER — SIMVASTATIN 5 MG PO TABS: 5.0000 mg | ORAL_TABLET | ORAL | Status: DC

## 2015-08-30 NOTE — Patient Instructions (Signed)
Your physician wants you to follow-up in: 6 months with Dr. Domenic Polite. You will receive a reminder letter in the mail two months in advance. If you don't receive a letter, please call our office to schedule the follow-up appointment.  Your physician recommends that you return for lab work Fasting   Your physician has recommended you make the following change in your medication:   Decrease Zocor to Every other Day  If you need a refill on your cardiac medications before your next appointment, please call your pharmacy.  Thank you for choosing [facility]!

## 2015-08-30 NOTE — Progress Notes (Signed)
Cardiology Office Note  Date: 08/30/2015   ID: Erin Morris, DOB 1986/03/27, MRN [id number]  PCP: Mickie Hillier, MD  Primary Cardiologist: [name], MD   Chief Complaint  Patient presents with  . Coronary Artery Disease   History of Present Illness: Erin Morris is a 29 y.o. female last seen in May. She presents for a routine follow-up visit, continues to do very well. She has no angina symptoms and reports no major functional limitations or significant shortness of breath. 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 Also still exercising regularly.  We reviewed her medications which are outlined below in unchanged. She has continued on low-dose statin therapy for plaque stabilization. We discussed obtaining a follow-up lipid panel and also high sensitivity CRP.  She continues to follow with Dr. Wolfgang Phoenix for primary care.  Past Medical History  Diagnosis Date  . Coronary atherosclerosis of native coronary artery     BMS to mid circumflex 2010  . NSTEMI (non-ST elevated myocardial infarction) ([place])     2010 - had prior workup for hypercoagulable state that was negative  . Mixed hyperlipidemia     Current Outpatient Prescriptions  Medication Sig Dispense Refill  . aspirin 81 MG EC tablet Take 81 mg by mouth daily.      Marland Kitchen loratadine (CLARITIN) 10 MG tablet Take 10 mg by mouth daily.    . nitroGLYCERIN (NITROSTAT) 0.4 MG SL tablet Place 0.4 mg under the tongue every 5 (five) minutes as needed for chest pain.    . simvastatin (ZOCOR) 5 MG tablet Take 1 tablet (5 mg total) by mouth every other day. 45 tablet 3   No current facility-administered medications for this visit.   Allergies:  Review of patient's allergies indicates no known allergies.   Social History: The patient  reports that she has never smoked. She does not have any smokeless tobacco history on file. She reports that she does not drink alcohol or use illicit drugs.     ROS:  Please see the history of present illness. Otherwise, complete review of systems is positive for none.  All other systems are reviewed and negative.   Physical Exam: VS:  BP 102/58 mmHg  Pulse 76  Ht 5\' 7"  (1.702 m)  Wt 192 lb (87.091 kg)  BMI 30.06 kg/m2  SpO2 99%  LMP 08/16/2015, BMI Body mass index is 30.06 kg/(m^2).  Wt Readings from Last 3 Encounters:  08/30/15 192 lb (87.091 kg)  02/04/15 184 lb (83.462 kg)  08/07/14 177 lb (80.287 kg)    Appears comfortable at rest. HEENT: Conjunctiva and lids normal, oropharynx clear. Neck: Supple, no elevated JVP or carotid bruits, no thyromegaly. Lungs: Clear to auscultation, nonlabored breathing at rest. Cardiac: Regular rate and rhythm, no S3 or significant systolic murmur, no pericardial rub. Extremities: No pitting edema, distal pulses 2+.  ECG: ECG is not ordered today.  Recent Labwork: 01/31/2015: ALT 14; AST 23     Component Value Date/Time   CHOL 85 01/31/2015 1935   TRIG 32 01/31/2015 1935   HDL 57 01/31/2015 1935   CHOLHDL 1.5 01/31/2015 1935   VLDL 6 01/31/2015 1935   [place] 22 01/31/2015 1935    Other Studies Reviewed Today:  Cardiac catheterization in March 2015 revealed widely patent BMS in the circumflex extending into the OM 2, otherwise normal appearing coronary arteries with low normal LVEF.  Assessment and Plan:  1. CAD status post BMS to the  circumflex in 2010, patent at angiography last March. She is doing very well symptomatically speaking. Continue regular exercise and medical therapy.  2. Follow-up FLP and high sensitivity CRP on low-dose Zocor. May be able to go to every other day dosing.  Current medicines were reviewed with the patient today.   Orders Placed This Encounter  Procedures  . Lipid Profile  . CRP High sensitivity    Disposition: FU with me in 6 months.   Signed, Satira Sark, MD, [facility] 08/30/2015 3:22 PM    [facility] at [facility] [address] [place], [place] [address] Phone: [phone]; Fax: [phone]

## 2015-09-03 ENCOUNTER — Other Ambulatory Visit: Payer: Self-pay

## 2015-09-03 ENCOUNTER — Telehealth: Payer: Self-pay | Admitting: Cardiology

## 2015-09-03 LAB — LIPID PANEL
Cholesterol: 90 mg/dL — ABNORMAL LOW (ref 125–200)
HDL: 52 mg/dL (ref >=46)
LDL Cholesterol: 28 mg/dL (ref <130)
Total CHOL/HDL Ratio: 1.7 Ratio (ref <=5.0)
Triglycerides: 48 mg/dL (ref <150)
VLDL: 10 mg/dL (ref <30)

## 2015-09-03 LAB — C-REACTIVE PROTEIN

## 2015-09-03 MED ORDER — SIMVASTATIN 5 MG PO TABS: 5.0000 mg | ORAL_TABLET | ORAL | Status: DC

## 2015-09-03 NOTE — Telephone Encounter (Signed)
Refill sent to [facility] pharmacy for Zocor

## 2015-09-03 NOTE — Telephone Encounter (Signed)
[facility] got your message about her lab work and would like you to send her Rx to the [facility] outpatient pharmacy

## 2015-10-14 DIAGNOSIS — Z01419 Encounter for gynecological examination (general) (routine) without abnormal findings: Secondary | ICD-10-CM | POA: Diagnosis not present

## 2015-10-14 DIAGNOSIS — N841 Polyp of cervix uteri: Secondary | ICD-10-CM | POA: Diagnosis not present

## 2015-10-14 DIAGNOSIS — Z6828 Body mass index (BMI) 28.0-28.9, adult: Secondary | ICD-10-CM | POA: Diagnosis not present

## 2015-10-30 DIAGNOSIS — R87610 Atypical squamous cells of undetermined significance on cytologic smear of cervix (ASC-US): Secondary | ICD-10-CM | POA: Diagnosis not present

## 2015-10-30 DIAGNOSIS — N870 Mild cervical dysplasia: Secondary | ICD-10-CM | POA: Diagnosis not present

## 2015-10-31 ENCOUNTER — Ambulatory Visit (INDEPENDENT_AMBULATORY_CARE_PROVIDER_SITE_OTHER): Payer: 59 | Admitting: Family Medicine

## 2015-10-31 ENCOUNTER — Encounter: Payer: Self-pay | Admitting: Family Medicine

## 2015-10-31 VITALS — BP 112/78 | Ht 67.0 in | Wt 179.6 lb

## 2015-10-31 DIAGNOSIS — R519 Headache, unspecified: Secondary | ICD-10-CM

## 2015-10-31 DIAGNOSIS — H543 Unqualified visual loss, both eyes: Secondary | ICD-10-CM | POA: Diagnosis not present

## 2015-10-31 DIAGNOSIS — R51 Headache: Secondary | ICD-10-CM

## 2015-10-31 NOTE — Progress Notes (Signed)
   Subjective:    Patient ID: Erin Morris, female    DOB: August 22, 1986, 30 y.o.   MRN: [id number]  HPI  Patient stated she had a headache with vision disturbance on Tuesday and wed pm- has never had a migraine before tues or any sx like this.  Noted loss of visio in the visual field  coud not see detail on the computer screen  Last night starting gewtting more frequent spells with centrl scotoma  Then developed headache and pressure  No photophobia   No phonophobia  Tylenol did not help  Feels like presuure  No fam hx of migraine headaches  Not on menstrual cycle  paragard IUD hormon  Has hx of CAD with neg w u for hypercoagulability, cir  Sees dr Domenic Polite regulaly following for blood work and stain use  Review of Systems No abdominal pain no chest pain no shortness of breath no focal neurological deficits    Objective:   Physical Exam   Alert no apparent distress. HEENT normal neck supple lungs clear. Heart regular in rhythm. Ocular exam normal. No focal neurological deficits.     Assessment & Plan:  Impression probable migraine headaches. If it wasn't for this patient's unusual history of known coronary artery disease and stenting at such a young age, therefore with concerns regarding hypercoagulability and/or vascular likely would treat this simply as migraine. Plan with unusual history will seek second opinion from neurologist to make sure something else is not going on. Warning signs discussed WSL

## 2015-11-04 ENCOUNTER — Encounter: Payer: Self-pay | Admitting: Neurology

## 2015-11-04 ENCOUNTER — Ambulatory Visit (INDEPENDENT_AMBULATORY_CARE_PROVIDER_SITE_OTHER): Payer: 59 | Admitting: Neurology

## 2015-11-04 ENCOUNTER — Ambulatory Visit (HOSPITAL_COMMUNITY)
Admission: RE | Admit: 2015-11-04 | Discharge: 2015-11-04 | Disposition: A | Discharge status: Home / Self Care | Payer: 59 | Source: Ambulatory Visit | Attending: Neurology | Admitting: Neurology

## 2015-11-04 VITALS — BP 128/70 | HR 74 | Ht 67.0 in | Wt 183.0 lb

## 2015-11-04 DIAGNOSIS — H547 Unspecified visual loss: Secondary | ICD-10-CM | POA: Insufficient documentation

## 2015-11-04 DIAGNOSIS — G43109 Migraine with aura, not intractable, without status migrainosus: Secondary | ICD-10-CM | POA: Diagnosis not present

## 2015-11-04 DIAGNOSIS — Z955 Presence of coronary angioplasty implant and graft: Secondary | ICD-10-CM | POA: Diagnosis not present

## 2015-11-04 DIAGNOSIS — H539 Unspecified visual disturbance: Secondary | ICD-10-CM

## 2015-11-04 DIAGNOSIS — E782 Mixed hyperlipidemia: Secondary | ICD-10-CM | POA: Diagnosis not present

## 2015-11-04 DIAGNOSIS — R51 Headache: Secondary | ICD-10-CM | POA: Diagnosis not present

## 2015-11-04 DIAGNOSIS — I2510 Atherosclerotic heart disease of native coronary artery without angina pectoris: Secondary | ICD-10-CM | POA: Diagnosis not present

## 2015-11-04 NOTE — Progress Notes (Signed)
Erin Morris was seen today in neurologic consultation at the request of Dr. Wolfgang Phoenix.  The consultation is for the evaluation of new onset headache.   This patient is accompanied in the office by her live in boyfriend who supplements the history.  The patient presents because of an episode that started on February 7 and eighth consisting of vision change and headache.  The patient states that this past Tuesday she was at work at had mild pressure in the frontal and occipital region and then about 1 hour later she got "splotchy" vision when looking at the computer screen.  She states that the screen looked pixelated and that lasted 10-15 min.  The pressure continued and is still there.  She had 3 vision episodes wed, 1 thurs and minimally yesterday.  It is worse when she is starting at the computer screen.  She cannot take NSAID's because of her bare metal stent and tylenol didn't help.   It feels like someone has a hand on her head squeezing.    There is some photophobia with the sun but not with unnatural light.  There is no phonophobia.  The patient reports that there is some associated nausea but no emesis.  The patient reports some conjunctival erythema but she thinks that is her seasonal allergies.  She hasn't worn her contacts because of this.  No hx of headaches and doesn't consider herself to be a headachey type of person.    The patient does have a paragard IUD.  She has a history of coronary artery disease with an NSTEMI in 2010.  She did have a hypercoagulable workup in 2010 that was negative.   ALLERGIES:  No Known Allergies  CURRENT MEDICATIONS:  Current Outpatient Prescriptions on File Prior to Visit  Medication Sig Dispense Refill  . aspirin 81 MG EC tablet Take 81 mg by mouth daily.      Marland Kitchen loratadine (CLARITIN) 10 MG tablet Take 10 mg by mouth daily.    . nitroGLYCERIN (NITROSTAT) 0.4 MG SL tablet Place 0.4 mg under the tongue every 5 (five) minutes as needed for chest pain.    .  simvastatin (ZOCOR) 5 MG tablet Take 1 tablet (5 mg total) by mouth every other day. 45 tablet 3   No current facility-administered medications on file prior to visit.    PAST MEDICAL HISTORY:   Past Medical History  Diagnosis Date  . Coronary atherosclerosis of native coronary artery     BMS to mid circumflex 2010  . NSTEMI (non-ST elevated myocardial infarction) ([place])     2010 - had prior workup for hypercoagulable state that was negative    PAST SURGICAL HISTORY:   Past Surgical History  Procedure Laterality Date  . Tonsillectomy    . Shoulder surgery Left   . Left heart catheterization with coronary angiogram N/A 12/15/2013    Procedure: LEFT HEART CATHETERIZATION WITH CORONARY ANGIOGRAM;  Surgeon: Troy Sine, MD;  Location: [facility] CATH LAB;  Service: Cardiovascular;  Laterality: N/A;    SOCIAL HISTORY:   Social History   Social History  . Marital Status: Single    Spouse Name: N/A  . Number of Children: N/A  . Years of Education: N/A   Occupational History  . radiology tech    Social History Main Topics  . Smoking status: Never Smoker   . Smokeless tobacco: Not on file  . Alcohol Use: 0.0 oz/week    0 Standard drinks or equivalent per week  Comment: maybe once a month  . Drug Use: No  . Sexual Activity: Not on file   Other Topics Concern  . Not on file   Social History Narrative   Single   Lives with her boyfriend   No pregnancy    FAMILY HISTORY:   Family Status  Relation Status Death Age  . Mother Alive     HTN, hypothyroidism  . Father Alive     colitis  . Paternal Uncle Alive     Father's twin brother, has a clotting disorder described as probably a factor V  . Sister Alive     healthy    ROS:  A complete 10 system review of systems was obtained and was unremarkable apart from what is mentioned above.  PHYSICAL EXAMINATION:    VITALS:   Filed Vitals:   11/04/15 0742  BP: 128/70  Pulse: 74  Height: 5\' 7"  (1.702 m)  Weight: 183 lb  (83.008 kg)    GEN:  Appears stated age and in NAD. HEENT:  Normocephalic, atraumatic. The mucous membranes are moist. The superficial temporal arteries are without ropiness or tenderness. Cardiovascular: Regular rate and rhythm. Lungs: Clear to auscultation bilaterally. Neck: There are no carotid bruits noted bilaterally.  NEUROLOGICAL: Orientation:  Pt is alert and oriented x 3.  Fund of knowledge is appropriate.  Recent and remote memory intact.  Attention and concentration normal.  Able to repeat and name. Cranial nerves: There is good facial symmetry. The pupils are equal round and reactive to light bilaterally. Funduscopic exam reveals clear disc margins bilaterally. Extraocular muscles are intact but there is alternating esotropia but no diplopia (pt denies hx of "lazy eye" as child) and visual fields are full to confrontational testing. Speech is fluent and clear. Soft palate rises symmetrically and there is no tongue deviation. Hearing is intact to conversational tone. Tone: Tone is good throughout. Sensation: Sensation is intact to light touch and pinprick throughout (facial, extremities, trunk). Vibration is intact at the bilateral big toe. There is no extinction with double simultaneous stimulation. There is no sensory dermatomal level identified. Coordination:  The patient has no difficulty with RAM's or FNF bilaterally. Motor: Strength is 5/5 in the bilateral upper and lower extremities. Shoulder shrug is equal and symmetric.  There is no pronator drift.  There are no fasciculations noted. DTR's: Deep tendon reflexes are 2-/4 at the bilateral biceps, triceps, brachioradialis, patella and achilles.  Plantar responses are downgoing bilaterally. Gait and Station: The patient is able to ambulate without difficulty. The patient is able to heel toe walk without any difficulty. The patient is able to ambulate in a tandem fashion. The patient is able to stand in the Romberg  position.   IMPRESSIONS/PLAN:  1.  New-onset headache, with visual change.  -certainly could just be migraine with aura but she does appear to have alternating esotropia (this looks chronic to me given no diplopia) and I will do an MRI brain.  She doesn't want any medication, but if she changes her mind a short course of medrol may help to break the headache cycle.  Talked to her about that today.  -not a candidate for triptans due to hx of CAD and states that unable to take NSAIDs per cardiology due to bare metal stent.  -if vision change continues, may consider doing Ach R AB's and MRA, but currently denies diplopia.  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  -will call me with questions or concerns.

## 2015-11-05 ENCOUNTER — Telehealth: Payer: Self-pay | Admitting: Neurology

## 2015-11-05 MED ORDER — METHYLPREDNISOLONE 4 MG PO TBPK: ORAL_TABLET | ORAL | Status: DC

## 2015-11-05 MED FILL — METHYLPREDNISOLONE 4 MG TAB: 4 | 6 days supply | Qty: 21 | Fill #0

## 2015-11-05 NOTE — Addendum Note (Signed)
Addended byAnnamaria Helling on: 11/05/2015 02:02 PM   Modules accepted: Orders

## 2015-11-05 NOTE — Telephone Encounter (Signed)
Mychart message sent to patient.

## 2015-11-05 NOTE — Telephone Encounter (Signed)
-----   Message from [place], DO sent at 11/05/2015  7:49 AM EST ----- Some motion artifact on the FLAIR.  Looks nl exam.  Luvenia Starch, she probably already knows since works there but let pt know that MRI looks good.  Find out if wants to try short course medrol

## 2015-11-05 NOTE — Telephone Encounter (Signed)
Patient called and would like to try Medrol dose pack. RX sent to patient's pharmacy.

## 2015-11-20 ENCOUNTER — Telehealth: Payer: Self-pay | Admitting: Neurology

## 2015-11-20 NOTE — Telephone Encounter (Signed)
Left message on machine for patient to call back. To see how she is doing after her medrol dose pack. Awaiting call back.

## 2015-11-28 ENCOUNTER — Ambulatory Visit: Payer: 59 | Admitting: Neurology

## 2015-12-02 MED FILL — SIMVASTATIN 5 MG TABLET: 5 | 90 days supply | Qty: 45 | Fill #1

## 2016-01-14 ENCOUNTER — Encounter: Payer: Self-pay | Admitting: Cardiology

## 2016-03-04 MED FILL — SIMVASTATIN 5 MG TABLET: 5 | 90 days supply | Qty: 45 | Fill #2

## 2016-03-11 ENCOUNTER — Ambulatory Visit: Payer: 59 | Admitting: Cardiology

## 2016-03-16 DIAGNOSIS — N870 Mild cervical dysplasia: Secondary | ICD-10-CM | POA: Diagnosis not present

## 2016-03-16 DIAGNOSIS — Z0142 Encounter for cervical smear to confirm findings of recent normal smear following initial abnormal smear: Secondary | ICD-10-CM | POA: Diagnosis not present

## 2016-03-31 ENCOUNTER — Ambulatory Visit: Payer: 59 | Admitting: Cardiology

## 2016-04-10 DIAGNOSIS — H52223 Regular astigmatism, bilateral: Secondary | ICD-10-CM | POA: Diagnosis not present

## 2016-04-10 DIAGNOSIS — H5213 Myopia, bilateral: Secondary | ICD-10-CM | POA: Diagnosis not present

## 2016-05-20 ENCOUNTER — Ambulatory Visit: Payer: 59 | Admitting: Cardiology

## 2016-06-02 MED FILL — SIMVASTATIN 5 MG TABLET: 5 | 90 days supply | Qty: 45 | Fill #3

## 2016-06-12 ENCOUNTER — Other Ambulatory Visit: Payer: Self-pay | Admitting: Cardiology

## 2016-06-12 ENCOUNTER — Ambulatory Visit (INDEPENDENT_AMBULATORY_CARE_PROVIDER_SITE_OTHER): Payer: 59 | Admitting: Cardiology

## 2016-06-12 ENCOUNTER — Encounter: Payer: Self-pay | Admitting: Cardiology

## 2016-06-12 VITALS — BP 97/62 | HR 71 | Ht 67.0 in | Wt 194.0 lb

## 2016-06-12 DIAGNOSIS — Z79899 Other long term (current) drug therapy: Secondary | ICD-10-CM | POA: Diagnosis not present

## 2016-06-12 DIAGNOSIS — I2510 Atherosclerotic heart disease of native coronary artery without angina pectoris: Secondary | ICD-10-CM

## 2016-06-12 NOTE — Patient Instructions (Signed)
Your physician wants you to follow-up in: [place] DR. Domenic Polite You will receive a reminder letter in the mail two months in advance. If you don't receive a letter, please call our office to schedule the follow-up appointment.  Your physician recommends that you continue on your current medications as directed. Please refer to the Current Medication list given to you today.  Your physician recommends that you return for lab work in: [place] TO LAB WORK   Thank you for choosing [facility]!!

## 2016-06-12 NOTE — Progress Notes (Signed)
Cardiology Office Note  Date: 06/12/2016   ID: Erin Morris, DOB 26-Aug-1986, MRN [id number]  PCP: Mickie Hillier, MD  Primary Cardiologist: [name], MD   Chief Complaint  Patient presents with  . Coronary Artery Disease    History of Present Illness: Erin Morris is a 30 y.o. female last seen in December 2016. She presents for a follow-up visit. Overall doing well from a cardiac perspective, she does not report any angina symptoms and has NYHA class I dyspnea. She still exercises. 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  We obtained follow-up lab work around the time of her last visit and ultimately cut back Zocor dosing with very low lipid numbers. This has predominantly been used for plaque stabilization over time since she was originally seen by Dr. Lia Foyer.  I reviewed her ECG today which shows normal sinus rhythm. Medications include aspirin, Zocor, and as needed nitroglycerin.  Past Medical History:  Diagnosis Date  . Coronary atherosclerosis of native coronary artery    BMS to mid circumflex 2010  . NSTEMI (non-ST elevated myocardial infarction) ([place])    2010 - had prior workup for hypercoagulable state that was negative    Past Surgical History:  Procedure Laterality Date  . LEFT HEART CATHETERIZATION WITH CORONARY ANGIOGRAM N/A 12/15/2013   Procedure: LEFT HEART CATHETERIZATION WITH CORONARY ANGIOGRAM;  Surgeon: Troy Sine, MD;  Location: [facility] CATH LAB;  Service: Cardiovascular;  Laterality: N/A;  . SHOULDER SURGERY Left   . TONSILLECTOMY      Current Outpatient Prescriptions  Medication Sig Dispense Refill  . aspirin 81 MG EC tablet Take 81 mg by mouth daily.      Marland Kitchen loratadine (CLARITIN) 10 MG tablet Take 10 mg by mouth daily.    . nitroGLYCERIN (NITROSTAT) 0.4 MG SL tablet Place 0.4 mg under the tongue every 5 (five) minutes as needed for chest pain.    . simvastatin (ZOCOR) 5 MG tablet Take 1 tablet (5 mg total) by mouth every  other day. 45 tablet 3   No current facility-administered medications for this visit.    Allergies:  Review of patient's allergies indicates no known allergies.   Social History: The patient  reports that she has never smoked. She has never used smokeless tobacco. She reports that she drinks alcohol. She reports that she does not use drugs.   ROS:  Please see the history of present illness. Otherwise, complete review of systems is positive for ocular migraines.  All other systems are reviewed and negative.   Physical Exam: VS:  BP 97/62   Pulse 71   Ht 5\' 7"  (1.702 m)   Wt 194 lb (88 kg)   BMI 30.38 kg/m , BMI Body mass index is 30.38 kg/m.  Wt Readings from Last 3 Encounters:  06/12/16 194 lb (88 kg)  11/04/15 183 lb (83 kg)  10/31/15 179 lb 9.6 oz (81.5 kg)    Appears comfortable at rest. HEENT: Conjunctiva and lids normal, oropharynx clear. Neck: Supple, no elevated JVP or carotid bruits, no thyromegaly. Lungs: Clear to auscultation, nonlabored breathing at rest. Cardiac: Regular rate and rhythm, no S3 or significant systolic murmur, no pericardial rub. Extremities: No pitting edema, distal pulses 2+.  ECG: I personally reviewed the tracing from 02/04/2015 which showed normal sinus rhythm.  Recent Labwork:    Component Value Date/Time   CHOL 90 (L) 09/02/2015 1631   TRIG 48 09/02/2015 1631   HDL 52 09/02/2015 1631  CHOLHDL 1.7 09/02/2015 1631   VLDL 10 09/02/2015 1631   LDLCALC 28 09/02/2015 1631  December 2016: CRP less than 0.5  Other Studies Reviewed Today:  Cardiac catheterization 12/15/2013: HEMODYNAMICS:   Central Aorta: 86/51   Left Ventricle: 86/2  ANGIOGRAPHY:  The left main coronary artery was angiographically normal and bifurcated into the LAD and left circumflex vessel.  The left anterior descending artery was angiographically and extended to the LV apex.   The left circumflex was a large caliber vessel. This gave rise to a very high  marginal branch. The proximal circumflex had a widely patent stent extended into the second marginal branch. The stent was widely patent without evidence for restenosis or any residual narrowing.  The RCA was angiographically normal it gave rise to the PDA and small posterolateral branch.  Left ventricular function was in the low-normal range. Visualization was suboptimal to truly delineate any wall motion abnormality definitively.  Assessment and Plan:  1. Symptomatically stable CAD status post BMS to the circumflex in 2010, patent at angiography in 2015. ECG is normal and she is doing well overall. Continue medical therapy and observation.  2. On statin therapy for plaque stabilization, very low-dose Zocor. We will follow-up lipid panel since dose was adjusted last time.  Current medicines were reviewed with the patient today.   Orders Placed This Encounter  Procedures  . Lipid panel  . EKG 12-Lead    Disposition: Follow-up with me in 6 months.  Signed, Satira Sark, MD, [facility] 06/12/2016 3:33 PM    [facility] at [place], [place], [place] [address] Phone: [phone]; Fax: [phone]

## 2016-06-13 LAB — LIPID PANEL
CHOLESTEROL: 106 mg/dL — AB (ref 125–200)
HDL: 62 mg/dL (ref >=46)
LDL Cholesterol: 36 mg/dL (ref <130)
TRIGLYCERIDES: 41 mg/dL (ref <150)
Total CHOL/HDL Ratio: 1.7 Ratio (ref <=5.0)
VLDL: 8 mg/dL (ref <30)

## 2016-06-17 ENCOUNTER — Encounter: Payer: Self-pay | Admitting: *Deleted

## 2016-07-01 ENCOUNTER — Encounter: Payer: Self-pay | Admitting: Cardiology

## 2016-08-03 DIAGNOSIS — N841 Polyp of cervix uteri: Secondary | ICD-10-CM | POA: Diagnosis not present

## 2016-08-03 DIAGNOSIS — R87610 Atypical squamous cells of undetermined significance on cytologic smear of cervix (ASC-US): Secondary | ICD-10-CM | POA: Diagnosis not present

## 2016-08-03 DIAGNOSIS — Z0142 Encounter for cervical smear to confirm findings of recent normal smear following initial abnormal smear: Secondary | ICD-10-CM | POA: Diagnosis not present

## 2016-08-24 DIAGNOSIS — N871 Moderate cervical dysplasia: Secondary | ICD-10-CM | POA: Diagnosis not present

## 2016-08-24 DIAGNOSIS — N870 Mild cervical dysplasia: Secondary | ICD-10-CM | POA: Diagnosis not present

## 2016-08-24 DIAGNOSIS — N879 Dysplasia of cervix uteri, unspecified: Secondary | ICD-10-CM | POA: Diagnosis not present

## 2016-08-28 ENCOUNTER — Other Ambulatory Visit: Payer: Self-pay | Admitting: *Deleted

## 2016-08-28 MED ORDER — SIMVASTATIN 5 MG PO TABS: 5.0000 mg | ORAL_TABLET | ORAL | 3 refills | Status: DC

## 2016-08-28 MED FILL — SIMVASTATIN 5 MG TABLET: 5 | 90 days supply | Qty: 45 | Fill #0

## 2016-09-11 DIAGNOSIS — M9902 Segmental and somatic dysfunction of thoracic region: Secondary | ICD-10-CM | POA: Diagnosis not present

## 2016-09-11 DIAGNOSIS — M546 Pain in thoracic spine: Secondary | ICD-10-CM | POA: Diagnosis not present

## 2016-09-11 DIAGNOSIS — G43101 Migraine with aura, not intractable, with status migrainosus: Secondary | ICD-10-CM | POA: Diagnosis not present

## 2016-09-11 DIAGNOSIS — M9901 Segmental and somatic dysfunction of cervical region: Secondary | ICD-10-CM | POA: Diagnosis not present

## 2016-09-25 DIAGNOSIS — N871 Moderate cervical dysplasia: Secondary | ICD-10-CM | POA: Diagnosis not present

## 2016-09-25 DIAGNOSIS — R87612 Low grade squamous intraepithelial lesion on cytologic smear of cervix (LGSIL): Secondary | ICD-10-CM | POA: Diagnosis not present

## 2016-09-25 DIAGNOSIS — N870 Mild cervical dysplasia: Secondary | ICD-10-CM | POA: Diagnosis not present

## 2016-10-16 ENCOUNTER — Encounter (HOSPITAL_COMMUNITY): Payer: Self-pay

## 2016-10-16 ENCOUNTER — Ambulatory Visit (HOSPITAL_COMMUNITY): Payer: 59

## 2016-11-12 ENCOUNTER — Encounter: Payer: Self-pay | Admitting: Family Medicine

## 2016-11-25 NOTE — Progress Notes (Signed)
Cardiology Office Note  Date: 11/26/2016   ID: BRIGHTEN BUZZELLI, DOB March 01, 1986, MRN [id number]  PCP: Mickie Hillier, MD  Primary Cardiologist: [name], MD   Chief Complaint  Patient presents with  . Coronary Artery Disease    History of Present Illness: Erin Morris is a 31 y.o. female last seen in September 2017. She presents for a routine follow-up visit. 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 She is getting better sleep. Still exercising without limitation, no angina symptoms.  I reviewed her medications which are outlined below. She remains on aspirin and very low-dose Zocor. She is due for follow-up lipid panel and LFTs.  ECG from September is outlined below.  Past Medical History:  Diagnosis Date  . Coronary atherosclerosis of native coronary artery    BMS to mid circumflex 2010  . NSTEMI (non-ST elevated myocardial infarction) ([place])    2010 - had prior workup for hypercoagulable state that was negative    Past Surgical History:  Procedure Laterality Date  . LEFT HEART CATHETERIZATION WITH CORONARY ANGIOGRAM N/A 12/15/2013   Procedure: LEFT HEART CATHETERIZATION WITH CORONARY ANGIOGRAM;  Surgeon: Troy Sine, MD;  Location: [facility] CATH LAB;  Service: Cardiovascular;  Laterality: N/A;  . SHOULDER SURGERY Left   . TONSILLECTOMY      Current Outpatient Prescriptions  Medication Sig Dispense Refill  . aspirin 81 MG EC tablet Take 81 mg by mouth daily.      Marland Kitchen CETIRIZINE HCL ALLERGY CHILD PO Take 10 mg by mouth daily.    . nitroGLYCERIN (NITROSTAT) 0.4 MG SL tablet Place 0.4 mg under the tongue every 5 (five) minutes as needed for chest pain.    . simvastatin (ZOCOR) 5 MG tablet Take 1 tablet (5 mg total) by mouth every other day. 45 tablet 3   No current facility-administered medications for this visit.    Allergies:  Patient has no known allergies.   Social History:  The patient  reports that she has never smoked. She has never used smokeless tobacco. She reports that she drinks alcohol. She reports that she does not use drugs.   ROS:  Please see the history of present illness. Otherwise, complete review of systems is positive for intermittent headaches - strip migraines.  All other systems are reviewed and negative.   Physical Exam: VS:  BP 110/64   Pulse 75   Ht 5\' 7"  (1.702 m)   Wt 195 lb (88.5 kg)   SpO2 98%   BMI 30.54 kg/m , BMI Body mass index is 30.54 kg/m.  Wt Readings from Last 3 Encounters:  11/26/16 195 lb (88.5 kg)  06/12/16 194 lb (88 kg)  11/04/15 183 lb (83 kg)    Appears comfortable at rest. HEENT: Conjunctiva and lids normal, oropharynx clear. Neck: Supple, no elevated JVP or carotid bruits, no thyromegaly. Lungs: Clear to auscultation, nonlabored breathing at rest. Cardiac: Regular rate and rhythm, no S3 or significant systolic murmur, no pericardial rub. Extremities: No pitting edema, distal pulses 2+.  ECG: I personally reviewed the tracing from 06/12/2016 which showed normal sinus rhythm.  Recent Labwork:    Component Value Date/Time   CHOL 106 (L) 06/12/2016 1606   TRIG 41 06/12/2016 1606   HDL 62 06/12/2016 1606   CHOLHDL 1.7 06/12/2016 1606   VLDL 8 06/12/2016 1606   LDLCALC 36 06/12/2016 1606  Other Studies Reviewed Today:  Cardiac catheterization 12/15/2013: HEMODYNAMICS:  Central Aorta: 86/51  Left Ventricle: 86/2  ANGIOGRAPHY:  The left main coronary artery was angiographically normal and bifurcated into the LAD and left circumflex vessel.  The left anterior descending artery was angiographically and extended to the LV apex.   The left circumflex was a large caliber vessel. This gave rise to a very high marginal branch. The proximal circumflex had a widely patent stent extended into the second marginal branch. The stent was widely patent without evidence for restenosis or any residual  narrowing.  The RCA was angiographically normal it gave rise to the PDA and small posterolateral branch.  Left ventricular function was in the low-normal range. Visualization was suboptimal to truly delineate any wall motion abnormality definitively.  Assessment and Plan:  1. CAD status post BMS to the circumflex in 2010. Follow-up cardiac catheterization in 2015 was reassuring. She is symptom-free at this time and continues on aspirin and statin. Continue regular exercise plan.  2. Follow-up lipid status as well as LFTs planned. She is on very low-dose Zocor with traditionally low lipid levels.  Current medicines were reviewed with the patient today.   Orders Placed This Encounter  Procedures  . Hepatic function panel  . Lipid Profile    Disposition: Follow-up in 6 months.  Signed, Satira Sark, MD, [facility] 11/26/2016 4:40 PM    [facility] [address] [place], [place] [address] Phone: [phone]; Fax: [phone]

## 2016-11-26 ENCOUNTER — Ambulatory Visit: Payer: 59 | Admitting: Cardiology

## 2016-11-26 ENCOUNTER — Encounter: Payer: Self-pay | Admitting: Cardiology

## 2016-11-26 ENCOUNTER — Ambulatory Visit (INDEPENDENT_AMBULATORY_CARE_PROVIDER_SITE_OTHER): Payer: 59 | Admitting: Cardiology

## 2016-11-26 VITALS — BP 110/64 | HR 75 | Ht 67.0 in | Wt 195.0 lb

## 2016-11-26 DIAGNOSIS — Z79899 Other long term (current) drug therapy: Secondary | ICD-10-CM

## 2016-11-26 DIAGNOSIS — I2510 Atherosclerotic heart disease of native coronary artery without angina pectoris: Secondary | ICD-10-CM

## 2016-11-26 NOTE — Patient Instructions (Addendum)
Your physician wants you to follow-up in: 6 months with Dr Ferne Reus will receive a reminder letter in the mail two months in advance. If you don't receive a letter, please call our office to schedule the follow-up appointment.    Get FASTING labs: LFT's,Lipids       If you need a refill on your cardiac medications before your next appointment, please call your pharmacy.      Thank you for choosing [facility] !

## 2016-11-27 ENCOUNTER — Other Ambulatory Visit (INDEPENDENT_AMBULATORY_CARE_PROVIDER_SITE_OTHER): Payer: 59

## 2016-11-27 DIAGNOSIS — I2510 Atherosclerotic heart disease of native coronary artery without angina pectoris: Secondary | ICD-10-CM

## 2016-11-27 LAB — HEPATIC FUNCTION PANEL
ALBUMIN: 4.4 g/dL (ref 3.5–5.2)
ALK PHOS: 45 U/L (ref 39–117)
ALT: 15 U/L (ref 0–35)
AST: 16 U/L (ref 0–37)
BILIRUBIN TOTAL: 0.6 mg/dL (ref 0.2–1.2)
Bilirubin, Direct: 0.2 mg/dL (ref 0.0–0.3)
Total Protein: 6.7 g/dL (ref 6.0–8.3)

## 2016-11-27 LAB — LIPID PANEL
Cholesterol: 99 mg/dL (ref 0–200)
HDL: 47.5 mg/dL (ref >39.00)
LDL CALC: 42 mg/dL (ref 0–99)
NONHDL: 51.1
Total CHOL/HDL Ratio: 2
Triglycerides: 44 mg/dL (ref 0.0–149.0)
VLDL: 8.8 mg/dL (ref 0.0–40.0)

## 2016-11-27 MED FILL — SIMVASTATIN 5 MG TABLET: 5 | 90 days supply | Qty: 45 | Fill #1 | Status: TO

## 2016-11-27 NOTE — Addendum Note (Signed)
Addended by: Frutoso Chase A on: 11/27/2016 08:26 AM   Modules accepted: Orders

## 2016-11-28 IMAGING — MR [HOSPITAL]^BRAIN
10 of 12 series · 34 of 48 positions shown · non-contrast
Comparison: None.

CLINICAL DATA: Headache with intermittent vision loss for 6 days.
No known injury. History of coronary atherosclerosis with mixed
hyperlipidemia, and previous cardiac stent placement.

EXAM:
MRI HEAD WITHOUT CONTRAST
TECHNIQUE: Multiplanar, multiecho pulse sequences of the brain and surrounding
structures were obtained without intravenous contrast.

[Series 2: T1 · sagittal · 5.0mm · 0.43mm/px · 1 of 21 slices shown (1 of 2)]
[im 1/21]
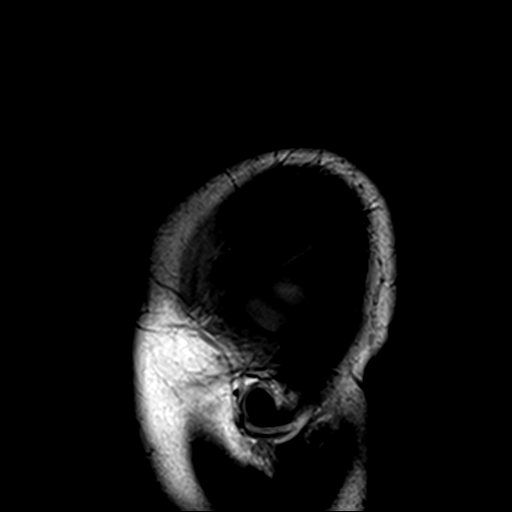

[Series 5: T2 · axial · 5.0mm · 0.51mm/px · 1 of 23 slices shown (1 of 2)]
[im 1/23]
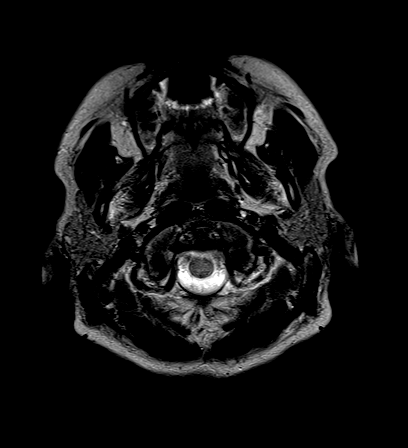

[Series 6: FLAIR · axial · 5.0mm · 0.38mm/px · z∈[-119,+24]mm · 2 of 23 slices shown]
[im 1/23]
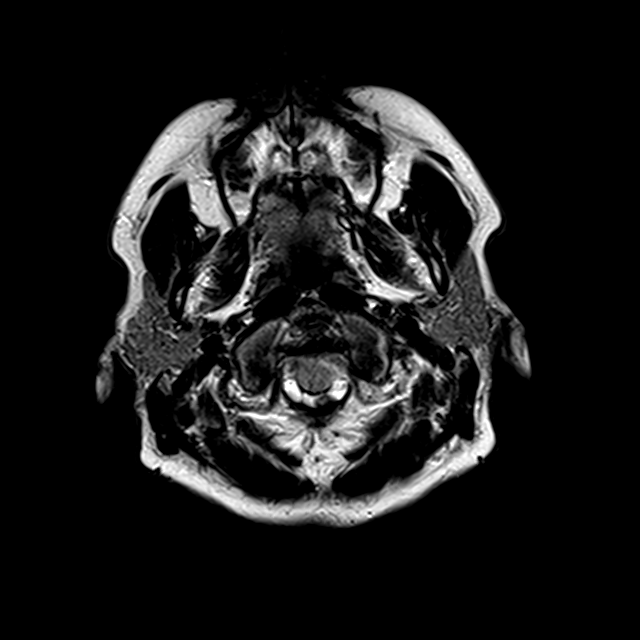
[im 23/23]
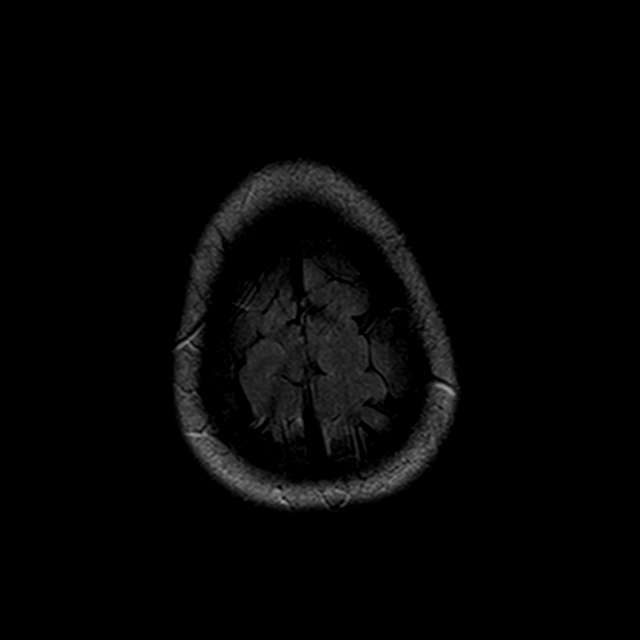

[Series 7: T1 · axial · 2.0mm · 0.45mm/px · z∈[-130,+57]mm · 8 of 95 slices shown (2 of 2)]
[im 1/95]
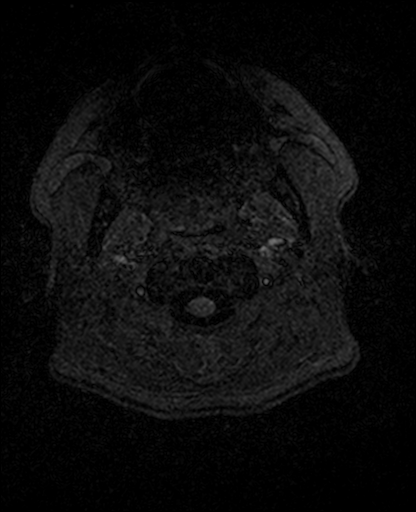
[im 14/95]
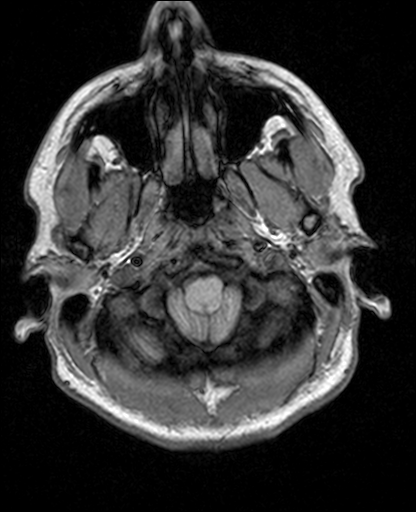
[im 27/95]
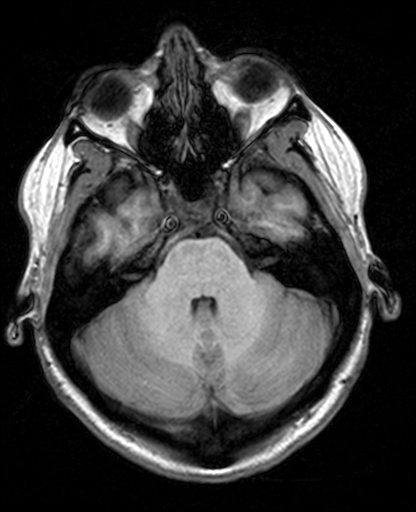
[im 41/95]
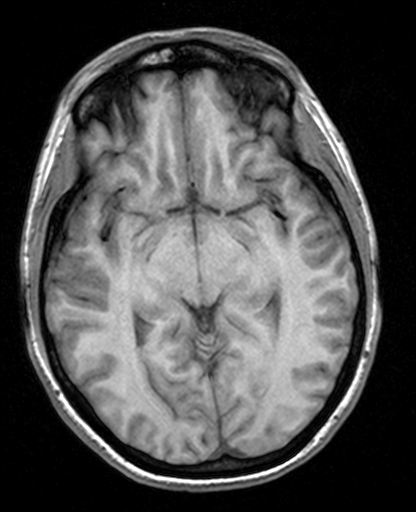
[im 54/95]
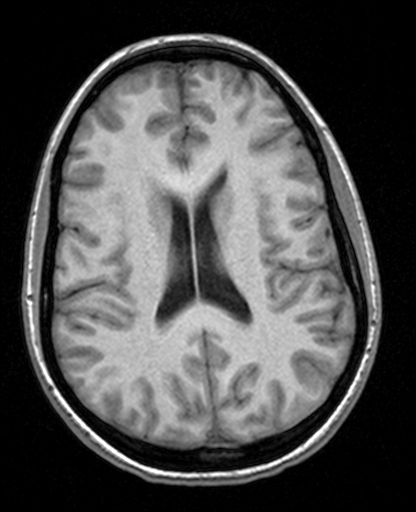
[im 68/95]
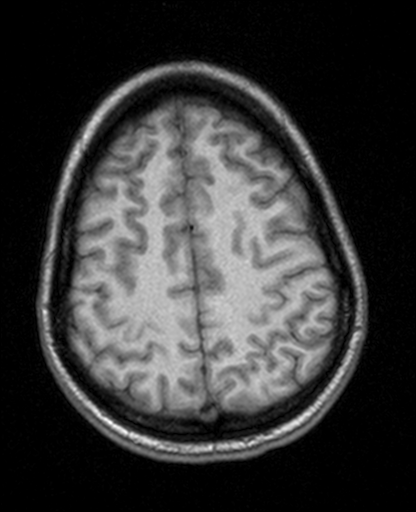
[im 81/95]
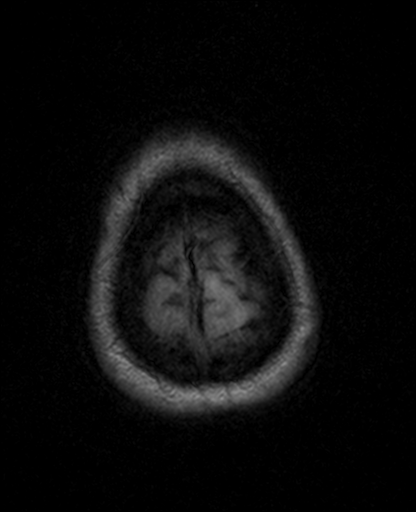
[im 95/95]
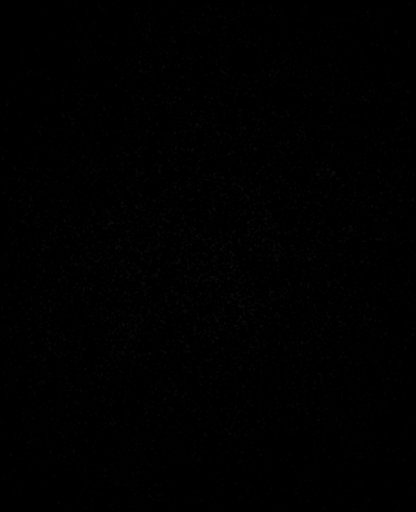

[Series 8: trauma axial · axial · 5.0mm · 0.45mm/px · z∈[-118,+25]mm · 2 of 23 slices shown]
[im 1/23]
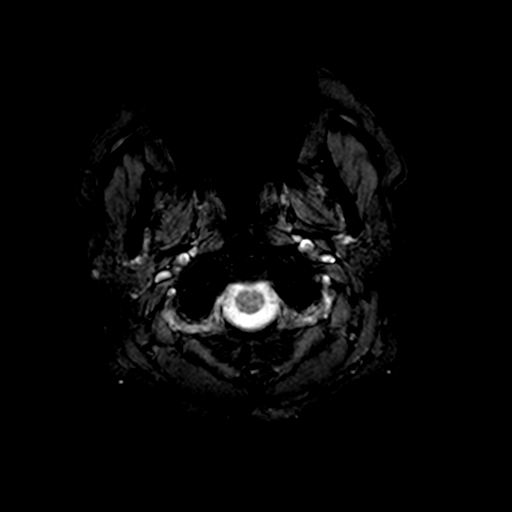
[im 23/23]
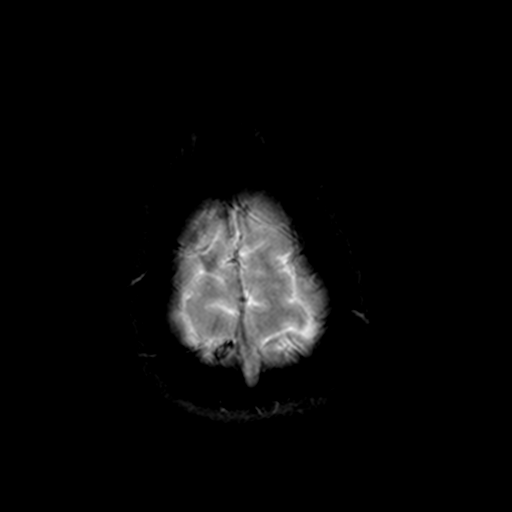

[Series 9: T2 · coronal · 5.0mm · 0.51mm/px · 2 of 24 slices shown (2 of 2)]
[im 1/24]
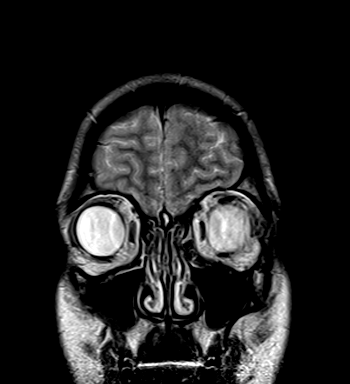
[im 24/24]
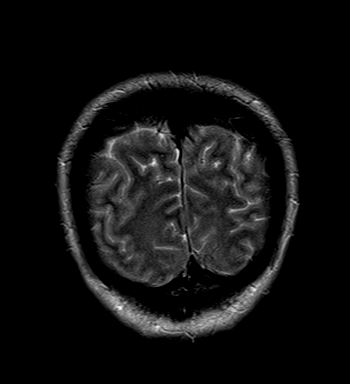

[Series 100: <mpr thick range> · axial · 3.0mm · 0.82mm/px · z∈[-113,+28]mm · 4 of 43 slices shown]
[im 1/43]
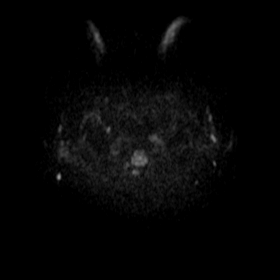
[im 15/43]
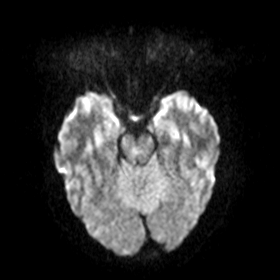
[im 29/43]
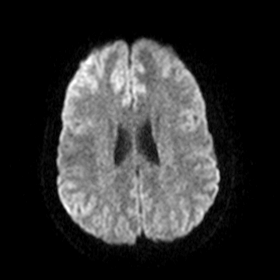
[im 43/43]
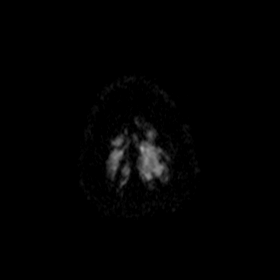

[Series 101: <mpr thick range(1)> · coronal · 3.0mm · 0.82mm/px · 5 of 58 slices shown]
[im 1/58]
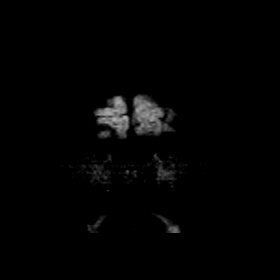
[im 15/58]
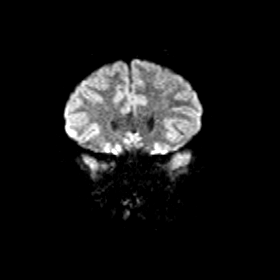
[im 29/58]
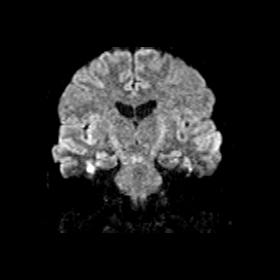
[im 43/58]
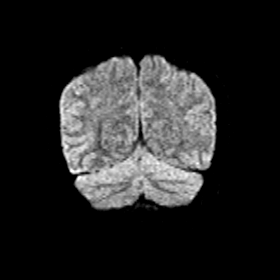
[im 58/58]
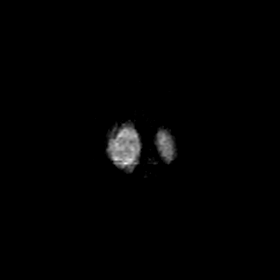

[Series 102: <mpr thick range(2)> · axial · 3.0mm · 0.82mm/px · z∈[-113,+28]mm · 4 of 42 slices shown]
[im 1/42]
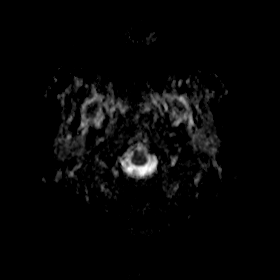
[im 14/42]
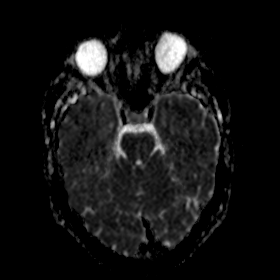
[im 28/42]
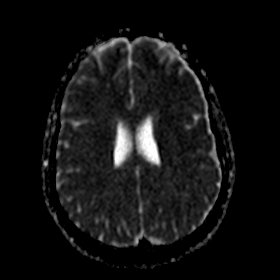
[im 42/42]
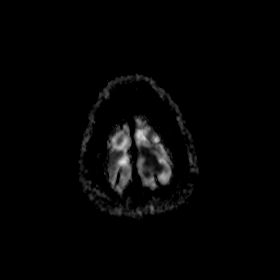

[Series 103: <mpr thick range(3)> · coronal · 3.0mm · 0.82mm/px · 5 of 58 slices shown]
[im 1/58]
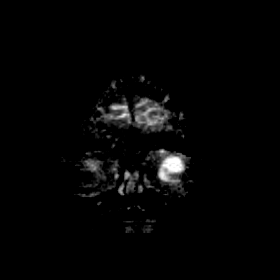
[im 15/58]
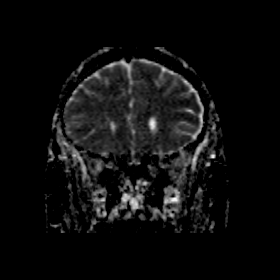
[im 29/58]
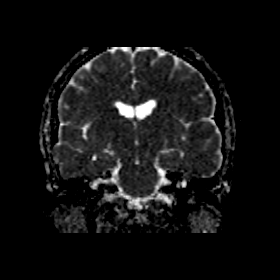
[im 43/58]
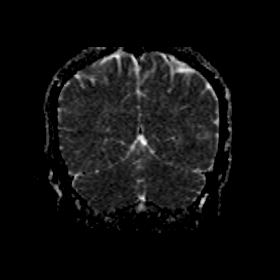
[im 58/58]
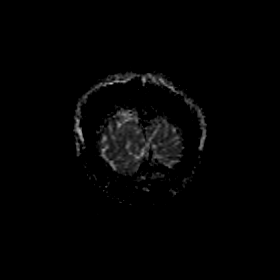

[34 of 48 positions shown; findings below may reference images not displayed]

FINDINGS: No evidence for acute infarction, hemorrhage, mass lesion,
hydrocephalus, or extra-axial fluid. Normal cerebral volume. No
white matter disease.

Flow voids are maintained throughout the carotid, basilar, and
vertebral arteries. There are no areas of chronic hemorrhage.

Physiologic pituitary prominence. Slight tonsillar ectopia of 4 mm.
No tonsillar impaction. No visible hydromyelia.

No significant paranasal sinus disease. Negative mastoids. Negative
orbits. Calvarium and scalp soft tissues unremarkable.
IMPRESSION: No acute intracranial findings.

No white matter disease to suggest complicated migraine or
demyelinating disease.

Mild tonsillar ectopia 4 mm. No tonsillar impaction to suggest
Chiari I malformation.

## 2016-12-10 ENCOUNTER — Ambulatory Visit (HOSPITAL_COMMUNITY)
Admission: RE | Admit: 2016-12-10 | Discharge: 2016-12-10 | Disposition: A | Discharge status: Home / Self Care | Payer: 59 | Source: Ambulatory Visit | Attending: Obstetrics and Gynecology | Admitting: Obstetrics and Gynecology

## 2016-12-10 ENCOUNTER — Telehealth: Payer: Self-pay | Admitting: Cardiology

## 2016-12-10 ENCOUNTER — Encounter (HOSPITAL_COMMUNITY): Payer: Self-pay

## 2016-12-10 DIAGNOSIS — Z955 Presence of coronary angioplasty implant and graft: Secondary | ICD-10-CM | POA: Diagnosis not present

## 2016-12-10 DIAGNOSIS — Z9889 Other specified postprocedural states: Secondary | ICD-10-CM

## 2016-12-10 DIAGNOSIS — I252 Old myocardial infarction: Secondary | ICD-10-CM | POA: Diagnosis not present

## 2016-12-10 DIAGNOSIS — Z3169 Encounter for other general counseling and advice on procreation: Secondary | ICD-10-CM

## 2016-12-10 HISTORY — DX: Papillomavirus as the cause of diseases classified elsewhere: B97.7

## 2016-12-10 HISTORY — DX: Unspecified abnormal cytological findings in specimens from vagina: R87.629
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
## 2016-12-10 NOTE — Telephone Encounter (Signed)
Patient saw maternal fetal specialist at [facility] is requesting patient have stress test and echo. Can this be ordered by Dr.Mcdowell/tg

## 2016-12-10 NOTE — Telephone Encounter (Signed)
Will forward to Dr. McDowell 

## 2016-12-10 NOTE — ED Notes (Signed)
Pt here at [facility] for preconception consult today.  VS are BP:127/69, HR 65, weight 194.4lbs.

## 2016-12-10 NOTE — Telephone Encounter (Signed)
Spoke with pt. She stated that her gynecologist sent her to have a pre-conception evaluation and the doctor she saw said she would feel better about things if she had a more recent stress test and echocardiogram to view prior to making any decisions for the patient. She has not had any problems and stated the note may not be completed yet as she only saw her this afternoon. She also stated it was not an urgent thing, but was trying to see what the process was.

## 2016-12-10 NOTE — Telephone Encounter (Signed)
I will review the note when available.

## 2016-12-10 NOTE — Telephone Encounter (Signed)
These are tests that we could order, however I would like to review the note from the maternal-fetal specialist first - I did not see anything in the chart. Erin Morris has been exercising without any limitation or angina symptoms to suggest progressive CAD.

## 2017-02-08 ENCOUNTER — Ambulatory Visit (INDEPENDENT_AMBULATORY_CARE_PROVIDER_SITE_OTHER): Payer: 59 | Admitting: Family Medicine

## 2017-02-08 ENCOUNTER — Encounter: Payer: Self-pay | Admitting: Family Medicine

## 2017-02-08 VITALS — BP 128/86 | HR 75 | Temp 98.6°F

## 2017-02-08 DIAGNOSIS — R0789 Other chest pain: Secondary | ICD-10-CM

## 2017-02-08 DIAGNOSIS — I2510 Atherosclerotic heart disease of native coronary artery without angina pectoris: Secondary | ICD-10-CM | POA: Diagnosis not present

## 2017-02-08 DIAGNOSIS — R079 Chest pain, unspecified: Secondary | ICD-10-CM

## 2017-02-08 LAB — COMPREHENSIVE METABOLIC PANEL
ALT: 37 U/L — ABNORMAL HIGH (ref 0–35)
AST: 22 U/L (ref 0–37)
Albumin: 4.4 g/dL (ref 3.5–5.2)
Alkaline Phosphatase: 48 U/L (ref 39–117)
BUN: 12 mg/dL (ref 6–23)
CO2: 26 mEq/L (ref 19–32)
Calcium: 9.5 mg/dL (ref 8.4–10.5)
Chloride: 105 mEq/L (ref 96–112)
Creatinine, Ser: 0.73 mg/dL (ref 0.40–1.20)
GFR: 98.78 mL/min (ref >60.00)
Glucose, Bld: 101 mg/dL — ABNORMAL HIGH (ref 70–99)
Potassium: 3.8 mEq/L (ref 3.5–5.1)
Sodium: 138 mEq/L (ref 135–145)
Total Bilirubin: 0.2 mg/dL (ref 0.2–1.2)
Total Protein: 6.8 g/dL (ref 6.0–8.3)

## 2017-02-08 LAB — CBC
HCT: 43.1 % (ref 36.0–46.0)
Hemoglobin: 14.5 g/dL (ref 12.0–15.0)
MCHC: 33.7 g/dL (ref 30.0–36.0)
MCV: 97.3 fl (ref 78.0–100.0)
Platelets: 157 10*3/uL (ref 150.0–400.0)
RBC: 4.42 Mil/uL (ref 3.87–5.11)
RDW: 13.3 % (ref 11.5–15.5)
WBC: 5.8 10*3/uL (ref 4.0–10.5)

## 2017-02-08 LAB — TROPONIN I: TNIDX: 0 ug/l (ref 0.00–0.06)

## 2017-02-08 LAB — D-DIMER, QUANTITATIVE: D-Dimer, Quant: 0.49 mcg/mL FEU (ref <0.50)

## 2017-02-08 LAB — SEDIMENTATION RATE: Sed Rate: 1 mm/hr (ref 0–20)

## 2017-02-08 LAB — C-REACTIVE PROTEIN: CRP: 0.4 mg/dL — ABNORMAL LOW (ref 0.5–20.0)

## 2017-02-08 NOTE — Progress Notes (Signed)
Erin Morris is a 31 y.o. female here for an acute visit.  History of Present Illness:   Erin Morris CMA acting as scribe for Dr. Juleen China.  HPI: Left-sided chest and back pain. Patient with history of coronary artery disease presenting with left-sided pain, ongoing for the last 3 days. It started while she was on her honeymoon. She cannot think of anything that she may have time to trigger musculoskeletal pain. It is dull, intermittent, worse with movement of the arm. She has no fevers, chills, shortness of breath, anterior chest pain, edema, leg pain, or other red flags. She is not a smoker. She is on no estrogen. She is on a baby aspirin daily. She would like to be checked for any changes to her EKG or lab work today.  PMHx, SurgHx, SocialHx, Medications, and Allergies were reviewed in the Visit Navigator and updated as appropriate.  Current Medications:   .  aspirin 81 MG EC tablet, Take 81 mg by mouth daily.  , Disp: , Rfl:  .  CETIRIZINE HCL ALLERGY CHILD PO, Take 10 mg by mouth daily., Disp: , Rfl:  .  nitroGLYCERIN (NITROSTAT) 0.4 MG SL tablet, Place 0.4 mg under the tongue every 5 (five) minutes as needed for chest pain., Disp: , Rfl:  .  simvastatin (ZOCOR) 5 MG tablet, Take 1 tablet (5 mg total) by mouth every other day., Disp: 45 tablet, Rfl: 3   No Known Allergies   Review of Systems:   Review of Systems  Constitutional: Negative for chills, fever, malaise/fatigue and weight loss.  Respiratory: Negative for cough, shortness of breath and wheezing.   Cardiovascular: Positive for chest pain. Negative for palpitations and leg swelling.  Gastrointestinal: Negative for abdominal pain, constipation, diarrhea, nausea and vomiting.  Genitourinary: Negative for dysuria and urgency.  Musculoskeletal: Positive for back pain. Negative for joint pain and myalgias.  Skin: Negative for rash.  Neurological: Negative for dizziness and headaches.  Psychiatric/Behavioral: Negative for  depression, substance abuse and suicidal ideas. The patient is not nervous/anxious.    Vitals:   Vitals:   02/08/17 0740  BP: 128/86  Pulse: 75  Temp: 98.6 F (37 C)  TempSrc: Oral  SpO2: 99%     There is no height or weight on file to calculate BMI.  Physical Exam:   Physical Exam  Constitutional: She appears well-nourished.  HENT:  Head: Normocephalic and atraumatic.  Eyes: EOM are normal. Pupils are equal, round, and reactive to light.  Neck: Normal range of motion. Neck supple.  Cardiovascular: Normal rate, regular rhythm, normal heart sounds and intact distal pulses.   Pulmonary/Chest: Effort normal.  Abdominal: Soft.  Skin: Skin is warm.  Psychiatric: She has a normal mood and affect. Her behavior is normal.  Nursing note and vitals reviewed.  Results for orders placed or performed in visit on 11/27/16  Hepatic function panel  Result Value Ref Range   Total Bilirubin 0.6 0.2 - 1.2 mg/dL   Bilirubin, Direct 0.2 0.0 - 0.3 mg/dL   Alkaline Phosphatase 45 39 - 117 U/L   AST 16 0 - 37 U/L   ALT 15 0 - 35 U/L   Total Protein 6.7 6.0 - 8.3 g/dL   Albumin 4.4 3.5 - 5.2 g/dL  Lipid panel  Result Value Ref Range   Cholesterol 99 0 - 200 mg/dL   Triglycerides 44.0 0.0 - 149.0 mg/dL   HDL 47.50 >39.00 mg/dL   VLDL 8.8 0.0 - 40.0 mg/dL  LDL Cholesterol 42 0 - 99 mg/dL   Total CHOL/HDL Ratio 2    NonHDL 51.10    Results for orders placed or performed in visit on 02/08/17  D-dimer, quantitative (not at [facility])  Result Value Ref Range   D-Dimer, Quant 0.49 <0.50 mcg/mL FEU  CBC  Result Value Ref Range   WBC 5.8 4.0 - 10.5 K/uL   RBC 4.42 3.87 - 5.11 Mil/uL   Platelets 157.0 150.0 - 400.0 K/uL   Hemoglobin 14.5 12.0 - 15.0 g/dL   HCT 43.1 36.0 - 46.0 %   MCV 97.3 78.0 - 100.0 fl   MCHC 33.7 30.0 - 36.0 g/dL   RDW 13.3 11.5 - 15.5 %  Comprehensive metabolic panel  Result Value Ref Range   Sodium 138 135 - 145 mEq/L   Potassium 3.8 3.5 - 5.1 mEq/L   Chloride 105  96 - 112 mEq/L   CO2 26 19 - 32 mEq/L   Glucose, Bld 101 (H) 70 - 99 mg/dL   BUN 12 6 - 23 mg/dL   Creatinine, Ser 0.73 0.40 - 1.20 mg/dL   Total Bilirubin 0.2 0.2 - 1.2 mg/dL   Alkaline Phosphatase 48 39 - 117 U/L   AST 22 0 - 37 U/L   ALT 37 (H) 0 - 35 U/L   Total Protein 6.8 6.0 - 8.3 g/dL   Albumin 4.4 3.5 - 5.2 g/dL   Calcium 9.5 8.4 - 10.5 mg/dL   GFR 98.78 >60.00 mL/min  Troponin I  Result Value Ref Range   TNIDX 0.00 0.00 - 0.06 ug/l  Sed Rate (ESR)  Result Value Ref Range   Sed Rate 1 0 - 20 mm/hr  C-reactive protein  Result Value Ref Range   CRP 0.4 (L) 0.5 - 20.0 mg/dL   EKG: normal EKG, normal sinus rhythm, unchanged from previous tracings.  Assessment and Plan:   Erin Morris was seen today for back pain.  Diagnoses and all orders for this visit:  Other chest pain Comments: Labs, EKG, exam reassuring. Likely MSK. History of CAD. Will send note to Cardiologist. Orders: -     EKG 12-Lead -     D-dimer, quantitative (not at [facility]) -     CBC -     Comprehensive metabolic panel -     Troponin I -     Sed Rate (ESR) -     C-reactive protein  Coronary artery disease involving native coronary artery of native heart without angina pectoris Comments: Notes reviewed. MFM recommending more recent ECHO, stress test.    . Reviewed expectations re: course of current medical issues. . Discussed self-management of symptoms. . Outlined signs and symptoms indicating need for more acute intervention. . Patient verbalized understanding and all questions were answered. Marland Kitchen Health Maintenance issues including appropriate healthy diet, exercise, and smoking avoidance were discussed with patient. . See orers for this visit as documented in the electronic medical record. . Patient received an After Visit Summary.  CMA served as Education administrator during this visit. History, Physical, and Plan performed by medical provider. The above documentation has been reviewed and is accurate and complete.  Briscoe Deutscher, D.O.   Briscoe Deutscher, DO [place], [place] [facility] 02/08/2017

## 2017-02-11 MED FILL — NITROGLYCERIN 0.4 MG TAB SL: 0.4 | 8 days supply | Qty: 25 | Fill #0

## 2017-02-19 MED FILL — SIMVASTATIN 5 MG TABLET: 5 | 90 days supply | Qty: 45 | Fill #0

## 2017-03-16 ENCOUNTER — Other Ambulatory Visit: Payer: Self-pay | Admitting: Emergency Medicine

## 2017-03-16 MED ORDER — TETANUS-DIPHTH-ACELL PERTUSSIS 5-2-15.5 LF-MCG/0.5 IM SUSP: 0.5000 mL | Freq: Once | INTRAMUSCULAR | 0 refills | Status: AC

## 2017-03-16 MED FILL — ADACEL SYRINGE: 5-2-15.5 | 1 days supply | Qty: 1 | Fill #0

## 2017-04-16 ENCOUNTER — Encounter: Payer: Self-pay | Admitting: Family Medicine

## 2017-05-20 MED FILL — SIMVASTATIN 5 MG TABLET: 5 | 90 days supply | Qty: 45 | Fill #1

## 2017-06-01 DIAGNOSIS — Z30432 Encounter for removal of intrauterine contraceptive device: Secondary | ICD-10-CM | POA: Diagnosis not present

## 2017-06-01 DIAGNOSIS — Z01419 Encounter for gynecological examination (general) (routine) without abnormal findings: Secondary | ICD-10-CM | POA: Diagnosis not present

## 2017-06-01 DIAGNOSIS — Z31430 Encounter of female for testing for genetic disease carrier status for procreative management: Secondary | ICD-10-CM | POA: Diagnosis not present

## 2017-06-01 DIAGNOSIS — Z6831 Body mass index (BMI) 31.0-31.9, adult: Secondary | ICD-10-CM | POA: Diagnosis not present

## 2017-07-06 DIAGNOSIS — H5213 Myopia, bilateral: Secondary | ICD-10-CM | POA: Diagnosis not present

## 2017-07-06 DIAGNOSIS — H52223 Regular astigmatism, bilateral: Secondary | ICD-10-CM | POA: Diagnosis not present

## 2017-07-08 MED FILL — XIIDRA 5% EYE DROPS: 5 | 30 days supply | Qty: 60 | Fill #0

## 2017-08-02 ENCOUNTER — Encounter: Payer: Self-pay | Admitting: Cardiology

## 2017-08-02 ENCOUNTER — Ambulatory Visit: Payer: 59 | Admitting: Cardiology

## 2017-08-02 VITALS — BP 110/76 | HR 59 | Ht 67.0 in | Wt 203.0 lb

## 2017-08-02 DIAGNOSIS — I2510 Atherosclerotic heart disease of native coronary artery without angina pectoris: Secondary | ICD-10-CM | POA: Diagnosis not present

## 2017-08-02 DIAGNOSIS — Z79899 Other long term (current) drug therapy: Secondary | ICD-10-CM | POA: Diagnosis not present

## 2017-08-02 NOTE — Progress Notes (Signed)
Cardiology Office Note  Date: 08/02/2017   ID: Erin Morris, DOB November 30, 1985, MRN [id number]  PCP: Erin Kirschner, MD  Primary Cardiologist: Erin Lesches, MD   Chief Complaint  Patient presents with  . Coronary Artery Disease    History of Present Illness: Erin Morris is a 31 y.o. female last seen in March.  She presents today for a routine follow-up visit.  Continues to do well from a cardiac perspective, reports no angina or functional limitations.  She is exercising with a friend after work, spends at least 30 minutes doing cardio.  She gets her heart rate up into the 140s without any symptoms.  I reviewed her medications, she continues on aspirin and low-dose simvastatin, generally has low lipid numbers as detailed below.  She has had no intolerances.  She has not required any nitroglycerin use.  We discussed her cardiac history.  She has done well with cardiac catheterization in 2015 being reassuring.  She is in the process of considering pregnancy, has been seen by a high risk OB clinic.  She is not actively trying at this point.  Past Medical History:  Diagnosis Date  . Coronary atherosclerosis of native coronary artery    BMS to mid circumflex 2010  . HPV in female   . NSTEMI (non-ST elevated myocardial infarction) ([place])    2010 - had prior workup for hypercoagulable state that was negative  . Vaginal Pap smear, abnormal     Past Surgical History:  Procedure Laterality Date  . addnoids    . SHOULDER SURGERY Left   . TONSILLECTOMY    . WISDOM TOOTH EXTRACTION      Current Outpatient Medications  Medication Sig Dispense Refill  . aspirin 81 MG EC tablet Take 81 mg by mouth daily.      Marland Kitchen CETIRIZINE HCL ALLERGY CHILD PO Take 10 mg by mouth daily.    . nitroGLYCERIN (NITROSTAT) 0.4 MG SL tablet Place 0.4 mg under the tongue every 5 (five) minutes as needed for chest pain.    Marland Kitchen PRENATAL VIT-DOCUSATE-IRON-FA PO Take by mouth.    . simvastatin (ZOCOR)  5 MG tablet Take 1 tablet (5 mg total) by mouth every other day. 45 tablet 3   No current facility-administered medications for this visit.    Allergies:  Patient has no known allergies.   Social History: The patient  reports that  has never smoked. she has never used smokeless tobacco. She reports that she drinks alcohol. She reports that she does not use drugs.   ROS:  Please see the history of present illness. Otherwise, complete review of systems is positive for none.  All other systems are reviewed and negative.   Physical Exam: VS:  BP 110/76   Pulse (!) 59   Ht 5\' 7"  (1.702 m)   Wt 203 lb (92.1 kg)   SpO2 98%   BMI 31.79 kg/m , BMI Body mass index is 31.79 kg/m.  Wt Readings from Last 3 Encounters:  08/02/17 203 lb (92.1 kg)  11/26/16 195 lb (88.5 kg)  06/12/16 194 lb (88 kg)    General: Patient appears comfortable at rest. HEENT: Conjunctiva and lids normal, oropharynx clear. Neck: Supple, no elevated JVP or carotid bruits, no thyromegaly. Lungs: Clear to auscultation, nonlabored breathing at rest. Cardiac: Regular rate and rhythm, no S3 or significant systolic murmur, no pericardial rub. Abdomen: Soft, nontender, bowel sounds present, no guarding or rebound. Extremities: No pitting edema, distal pulses 2+.  ECG: I personally reviewed the tracing from 02/08/2017 which showed sinus rhythm.  Recent Labwork: 02/08/2017: ALT 37; AST 22; BUN 12; Creatinine, Ser 0.73; Hemoglobin 14.5; Platelets 157.0; Potassium 3.8; Sodium 138     Component Value Date/Time   CHOL 99 11/27/2016 0826   TRIG 44.0 11/27/2016 0826   HDL 47.50 11/27/2016 0826   CHOLHDL 2 11/27/2016 0826   VLDL 8.8 11/27/2016 0826   LDLCALC 42 11/27/2016 0826    Other Studies Reviewed Today:  Cardiac catheterization 12/15/2013: HEMODYNAMICS:  Central Aorta: 86/51  Left Ventricle: 86/2  ANGIOGRAPHY:  The left main coronary artery was angiographically normal and bifurcated into the LAD and left  circumflex vessel.  The left anterior descending artery was angiographically and extended to the LV apex.   The left circumflex was a large caliber vessel. This gave rise to a very high marginal branch. The proximal circumflex had a widely patent stent extended into the second marginal branch. The stent was widely patent without evidence for restenosis or any residual narrowing.  The RCA was angiographically normal it gave rise to the PDA and small posterolateral branch.  Left ventricular function was in the low-normal range. Visualization was suboptimal to truly delineate any wall motion abnormality definitively.  Assessment and Plan:  1.  CAD with history of BMS to the circumflex in 2010, patent at angiography in 2015.  She is doing very well without active symptoms and remains on aspirin and low-dose statin therapy.  She is exercising regularly without limitation.  We will continue with observation at this time.  2.  Last LDL 42, on low-dose Zocor.  Current medicines were reviewed with the patient today.  Disposition: Follow-up in 6 months, sooner if needed.  Signed, Satira Sark, MD, [facility] 08/02/2017 4:24 PM    [facility] at [facility] [address] [place], [place] [address] Phone: [phone]; Fax: [phone]

## 2017-08-02 NOTE — Patient Instructions (Addendum)
Your physician wants you to follow-up in: 6 months with Dr.McDowell You will receive a reminder letter in the mail two months in advance. If you don't receive a letter, please call our office to schedule the follow-up appointment.    Your physician recommends that you continue on your current medications as directed. Please refer to the Current Medication list given to you today.    If you need a refill on your cardiac medications before your next appointment, please call your pharmacy.     No labs or tests ordered     Thank you for choosing [facility] !

## 2017-09-24 ENCOUNTER — Telehealth: Payer: Self-pay | Admitting: Family Medicine

## 2017-09-24 NOTE — Telephone Encounter (Signed)
Review cytopathology report in results folder from [facility].

## 2017-09-30 ENCOUNTER — Encounter: Payer: Self-pay | Admitting: Family Medicine

## 2017-10-12 ENCOUNTER — Encounter: Payer: Self-pay | Admitting: *Deleted

## 2017-10-26 ENCOUNTER — Ambulatory Visit: Payer: 59 | Admitting: Family Medicine

## 2017-10-26 VITALS — BP 122/82 | Ht 67.0 in | Wt 205.4 lb

## 2017-10-26 DIAGNOSIS — G43109 Migraine with aura, not intractable, without status migrainosus: Secondary | ICD-10-CM | POA: Diagnosis not present

## 2017-10-26 MED ORDER — ONDANSETRON 4 MG PO TBDP: 4.0000 mg | ORAL_TABLET | Freq: Three times a day (TID) | ORAL | 2 refills | Status: DC | PRN

## 2017-10-26 MED FILL — ONDANSETRON ODT 4 MG TABLET: 4 | 7 days supply | Qty: 20 | Fill #0

## 2017-10-26 NOTE — Progress Notes (Signed)
   Subjective:    Patient ID: Erin Morris, female    DOB: 18-Jul-1986, 32 y.o.   MRN: [id number]  HPI  Patient arrives with c/o migraines. Patient states she is having more frequent issues and had to miss a day of work last week.  Initially pt experiencing one every two or three months, then changed over time  Now getting once or twice per month  No getting vision effect in the right eye, and not always a headfache  Had to call out of work the other week    Wondering if may be visual  Change or right eye problem    wonering if may be hormone imbalance, strange to pt that wondering if may be also some sort of deficiency     paernal aunt experienced migraines in the late twenties   exercies reg  Has stopped her statins cause trying to get pregnnt   + Review of Systems No headache, no major weight loss or weight gain, no chest pain no back pain abdominal pain no change in bowel habits complete ROS otherwise negative     Objective:   Physical Exam  Alert and oriented, vitals reviewed and stable, NAD ENT-TM's and ext canals WNL bilat via otoscopic exam Soft palate, tonsils and post pharynx WNL via oropharyngeal exam Neck-symmetric, no masses; thyroid nonpalpable and nontender Pulmonary-no tachypnea or accessory muscle use; Clear without wheezes via auscultation Card--no abnrml murmurs, rhythm reg and rate WNL Carotid pulses symmetric, without bruits       Assessment & Plan:  Impression migraine headaches with visual aura.  Sometimes complete ocular migraines with visual symptoms and no subsequent head pain.  Patient experiencing 1-2/month.  With her underlying coronary artery disease not a candidate for triptan's.  Frequency not enough to warrant daily prophylaxis intervention at this point.  Symptom care discussed.  Add Zofran as needed for nausea.  Also has to avoid ibuprofen with heart history many questions answered  Greater than 50% of this 25 minute face to  face visit was spent in counseling and discussion and coordination of care regarding the above diagnosis/diagnosies

## 2017-12-09 ENCOUNTER — Encounter: Payer: Self-pay | Admitting: Family Medicine

## 2018-01-03 ENCOUNTER — Encounter: Payer: Self-pay | Admitting: Family Medicine

## 2018-01-03 DIAGNOSIS — Z1283 Encounter for screening for malignant neoplasm of skin: Secondary | ICD-10-CM

## 2018-01-11 ENCOUNTER — Encounter: Payer: Self-pay | Admitting: Family Medicine

## 2018-01-11 ENCOUNTER — Encounter (INDEPENDENT_AMBULATORY_CARE_PROVIDER_SITE_OTHER): Payer: Self-pay

## 2018-02-09 DIAGNOSIS — D229 Melanocytic nevi, unspecified: Secondary | ICD-10-CM | POA: Diagnosis not present

## 2018-02-09 DIAGNOSIS — L723 Sebaceous cyst: Secondary | ICD-10-CM | POA: Diagnosis not present

## 2018-02-09 NOTE — Progress Notes (Signed)
Erin Morris. Erin Morris, [place] [facility] [place]  Erin Morris - 32 y.o. female MRN [id number]  Date of birth: 1986/06/26  Visit Date: 02/10/2018  PCP: Mikey Kirschner, MD   Referred by: Mikey Kirschner, MD  Scribe for today's visit: Josepha Pigg, CMA     SUBJECTIVE:  Erin Morris is here for Initial Assessment (L shoulder pain)  Her L shoulder pain symptoms INITIALLY: Began several years ago but has started to flare up again over the past 6 months. SLAP repair in 2008 (Dr. Durward Fortes).  Described as moderate discomfort and numbness with radiation to Lt elbow.  Worsened with abduction  Improved with nothing  Additional associated symptoms include: Pain is anterior, posterior and radiates down the back of the arm.     At this time symptoms worsening compared to onset. Increased numbness in Lt upper extremity and pain in Lt scapula   She has been taking Tylenol prn with some relief. She has tried using heat and mineral ice with minimal relief.   ROS Reports night time disturbances. Denies fevers, chills, or night sweats. Denies unexplained weight loss. Denies personal history of cancer. Denies changes in bowel or bladder habits. Denies recent unreported falls. Denies new or worsening dyspnea or wheezing. Reports headaches or dizziness.  Reports numbness, tingling or weakness  In the extremities.  Denies dizziness or presyncopal episodes Denies lower extremity edema    HISTORY & PERTINENT PRIOR DATA:  Prior History reviewed and updated per electronic medical record.  Significant/pertinent history, findings, studies include:  reports that she has never smoked. She has never used smokeless tobacco. No results for input(s): HGBA1C, LABURIC, CREATINE in the last 8760 hours. No specialty comments available. No problems updated.  OBJECTIVE:  VS:  HT:5\' 7"  (170.2 cm)   WT:195 lb (88.5 kg)  BMI:30.53    BP:112/72   HR:76bpm  TEMP: ( )  RESP:98 %   PHYSICAL EXAM: Constitutional: WDWN, Non-toxic appearing. Psychiatric: Alert & appropriately interactive.  Not depressed or anxious appearing. Respiratory: No increased work of breathing.  Trachea Midline Eyes: Pupils are equal.  EOM intact without nystagmus.  No scleral icterus  Vascular Exam: warm to touch no edema  upper extremity neuro exam: unremarkable normal sensation  MSK Exam: Left shoulder range of motion.  There is crepitation with overhead reaching.  She has pain with axial loading circumduction but this is minimal.  The pain is most focally with axial loading.  Intrinsic rotator cuff strength is intact.  She has pain with speeds testing and O'Brien's testing.  Empty can testing is normal.  Negative Hawkins and Neer's.   ASSESSMENT & PLAN:   1. Chronic left shoulder pain     PLAN: Concern for potential recurrent labral tear.  Intra-articular injection performed today and therapeutic exercises reviewed and encouraged to resume in the coming days.  She will check in with [place] as needed if any lack of improvement would recommend repeat MR arthrogram.  Follow-up: Return in about 6 weeks (around 03/24/2018), or if symptoms worsen or fail to improve.      Please see additional documentation for Objective, Assessment and Plan sections. Pertinent additional documentation may be included in corresponding procedure notes, imaging studies, problem based documentation and patient instructions. Please see these sections of the encounter for additional information regarding this visit.  CMA/ATC served as Education administrator during this visit. History, Physical, and Plan performed by medical provider. Documentation and orders reviewed and  attested to.      Gerda Diss, [facility] Physician

## 2018-02-10 ENCOUNTER — Ambulatory Visit: Payer: Self-pay

## 2018-02-10 ENCOUNTER — Ambulatory Visit: Payer: 59 | Admitting: Sports Medicine

## 2018-02-10 ENCOUNTER — Encounter: Payer: Self-pay | Admitting: Sports Medicine

## 2018-02-10 VITALS — BP 112/72 | HR 76 | Ht 67.0 in | Wt 195.0 lb

## 2018-02-10 DIAGNOSIS — M25512 Pain in left shoulder: Principal | ICD-10-CM

## 2018-02-10 DIAGNOSIS — G8929 Other chronic pain: Secondary | ICD-10-CM | POA: Diagnosis not present

## 2018-02-10 NOTE — Patient Instructions (Signed)

## 2018-02-10 NOTE — Progress Notes (Signed)
PROCEDURE NOTE:  Ultrasound Guided: Injection: Left shoulder, intra-articular Images were obtained and interpreted by myself, Teresa Coombs, DO  Images have been saved and stored to PACS system. Images obtained on: GE S7 Ultrasound machine  DESCRIPTION OF PROCEDURE:  The patient's clinical condition is marked by substantial pain and/or significant functional disability. Other conservative therapy has not provided relief, is contraindicated, or not appropriate. There is a reasonable likelihood that injection will significantly improve the patient's pain and/or functional impairment.   After discussing the risks, benefits and expected outcomes of the injection and all questions were reviewed and answered, the patient wished to undergo the above named procedure.  Verbal consent was obtained.  The ultrasound was used to identify the target structure and adjacent neurovascular structures. The skin was then prepped in sterile fashion and the target structure was injected under direct visualization using sterile technique as below:  PREP: Alcohol and Ethel Chloride APPROACH: posterior, stopcock technique, 21g 2 in. INJECTATE: 3 cc 1% lidocaine, 2 cc 0.5% Marcaine and 2 cc 40mg /mL DepoMedrol ASPIRATE: None DRESSING: Band-Aid  Post procedural instructions including recommending icing and warning signs for infection were reviewed.    This procedure was well tolerated and there were no complications.   IMPRESSION: Succesful Ultrasound Guided: Injection

## 2018-02-21 ENCOUNTER — Encounter: Payer: Self-pay | Admitting: Sports Medicine

## 2018-03-01 ENCOUNTER — Encounter: Payer: Self-pay | Admitting: Cardiology

## 2018-03-01 ENCOUNTER — Telehealth: Payer: Self-pay

## 2018-03-01 DIAGNOSIS — E782 Mixed hyperlipidemia: Secondary | ICD-10-CM

## 2018-03-01 NOTE — Telephone Encounter (Signed)
I wlll e-mail pt that labs are entered in [facility]

## 2018-03-01 NOTE — Progress Notes (Signed)
Cardiology Office Note  Date: 03/02/2018   ID: Erin Morris, DOB Apr 11, 1986, MRN [id number]  PCP: Mikey Kirschner, MD  Primary Cardiologist: [name], MD   Chief Complaint  Patient presents with  . Coronary Artery Disease    History of Present Illness: Erin Morris is a 32 y.o. female last seen in November 2018.  She is here for a follow-up visit.  Overall doing well.  Still working as a Chartered loss adjuster.  She also has an Soil scientist that has been helping her with diet and exercise plan, she has lost weight and is feeling better.  She does not report any exertional angina, has NYHA class I dyspnea with typical activities.  No palpitations or syncope.  She had a follow-up lipid panel yesterday.  LDL was 52 off Zocor.  HDL 37.    She is planning to start a family, likely to get pregnant within the next year.  IUD was removed.  From a cardiac perspective she is only on low-dose aspirin at this time.  I personally reviewed her ECG today which is normal.  Past Medical History:  Diagnosis Date  . Coronary atherosclerosis of native coronary artery    BMS to mid circumflex 2010  . HPV in female   . NSTEMI (non-ST elevated myocardial infarction) ([place])    2010 - had prior workup for hypercoagulable state that was negative  . Vaginal Pap smear, abnormal     Past Surgical History:  Procedure Laterality Date  . addnoids    . LEFT HEART CATHETERIZATION WITH CORONARY ANGIOGRAM N/A 12/15/2013   Procedure: LEFT HEART CATHETERIZATION WITH CORONARY ANGIOGRAM;  Surgeon: Troy Sine, MD;  Location: [facility] CATH LAB;  Service: Cardiovascular;  Laterality: N/A;  . SHOULDER SURGERY Left   . TONSILLECTOMY    . WISDOM TOOTH EXTRACTION      Current Outpatient Medications  Medication Sig Dispense Refill  . aspirin 81 MG EC tablet Take 81 mg by mouth daily.      Marland Kitchen CETIRIZINE HCL ALLERGY CHILD PO Take 10 mg by mouth daily.    . nitroGLYCERIN (NITROSTAT) 0.4 MG SL tablet Place  0.4 mg under the tongue every 5 (five) minutes as needed for chest pain.    Marland Kitchen ondansetron (ZOFRAN ODT) 4 MG disintegrating tablet Take 1 tablet (4 mg total) by mouth every 8 (eight) hours as needed for nausea or vomiting. 20 tablet 2  . PRENATAL VIT-DOCUSATE-IRON-FA PO Take by mouth.     No current facility-administered medications for this visit.    Allergies:  Nsaids   Social History: The patient  reports that she has never smoked. She has never used smokeless tobacco. She reports that she drinks alcohol. She reports that she does not use drugs.   ROS:  Please see the history of present illness. Otherwise, complete review of systems is positive for none.  All other systems are reviewed and negative.   Physical Exam: VS:  BP 110/70   Pulse 60   Ht 5\' 7"  (1.702 m)   Wt 193 lb 9.6 oz (87.8 kg)   SpO2 97%   BMI 30.32 kg/m , BMI Body mass index is 30.32 kg/m.  Wt Readings from Last 3 Encounters:  03/02/18 193 lb 9.6 oz (87.8 kg)  02/10/18 195 lb (88.5 kg)  10/26/17 205 lb 6.4 oz (93.2 kg)    General: Patient appears comfortable at rest. HEENT: Conjunctiva and lids normal, oropharynx clear. Neck: Supple, no elevated JVP or carotid  bruits, no thyromegaly. Lungs: Clear to auscultation, nonlabored breathing at rest. Cardiac: Regular rate and rhythm, no S3 or significant systolic murmur, no pericardial rub. Abdomen: Soft, nontender, bowel sounds present. Extremities: No pitting edema, distal pulses 2+.  ECG: I personally reviewed the tracing from 02/08/2017 which showed normal sinus rhythm.  Recent Labwork:    Component Value Date/Time   CHOL 100 03/02/2018 0718   TRIG 58.0 03/02/2018 0718   HDL 36.50 (L) 03/02/2018 0718   CHOLHDL 3 03/02/2018 0718   VLDL 11.6 03/02/2018 0718   LDLCALC 52 03/02/2018 0718    Other Studies Reviewed Today:  Cardiac catheterization 12/15/2013: HEMODYNAMICS:  Central Aorta: 86/51  Left Ventricle: 86/2  ANGIOGRAPHY:  The left main  coronary artery was angiographically normal and bifurcated into the LAD and left circumflex vessel.  The left anterior descending artery was angiographically and extended to the LV apex.   The left circumflex was a large caliber vessel. This gave rise to a very high marginal branch. The proximal circumflex had a widely patent stent extended into the second marginal branch. The stent was widely patent without evidence for restenosis or any residual narrowing.  The RCA was angiographically normal it gave rise to the PDA and small posterolateral branch.  Left ventricular function was in the low-normal range. Visualization was suboptimal to truly delineate any wall motion abnormality definitively.  Assessment and Plan:  CAD with history of BMS to the circumflex in 2010.  Angiography in 2015 showed patent stent site.  She continues to do well without angina and a regular exercise plan with diet.  Continue low-dose aspirin.  Of note, she is on statin therapy with plans to start a family over the next year.  Recent follow-up lipids show LDL 52, HDL 37.  We continue with observation.  Will follow-up in 6 months.  Current medicines were reviewed with the patient today.   Orders Placed This Encounter  Procedures  . EKG 12-Lead    Disposition: Follow-up in 6 months.  Signed, Satira Sark, MD, [facility] 03/02/2018 4:00 PM    [place] at [facility] [address] [place],  [address] Phone: [phone]; Fax: [phone]

## 2018-03-02 ENCOUNTER — Other Ambulatory Visit (INDEPENDENT_AMBULATORY_CARE_PROVIDER_SITE_OTHER): Payer: 59

## 2018-03-02 ENCOUNTER — Ambulatory Visit: Payer: 59 | Admitting: Cardiology

## 2018-03-02 ENCOUNTER — Encounter: Payer: Self-pay | Admitting: Cardiology

## 2018-03-02 VITALS — BP 110/70 | HR 60 | Ht 67.0 in | Wt 193.6 lb

## 2018-03-02 DIAGNOSIS — M542 Cervicalgia: Secondary | ICD-10-CM | POA: Diagnosis not present

## 2018-03-02 DIAGNOSIS — E782 Mixed hyperlipidemia: Secondary | ICD-10-CM | POA: Diagnosis not present

## 2018-03-02 DIAGNOSIS — I2510 Atherosclerotic heart disease of native coronary artery without angina pectoris: Secondary | ICD-10-CM

## 2018-03-02 LAB — LIPID PANEL
CHOLESTEROL: 100 mg/dL (ref 0–200)
HDL: 36.5 mg/dL — ABNORMAL LOW (ref >39.00)
LDL CALC: 52 mg/dL (ref 0–99)
NonHDL: 63.76
TRIGLYCERIDES: 58 mg/dL (ref 0.0–149.0)
Total CHOL/HDL Ratio: 3
VLDL: 11.6 mg/dL (ref 0.0–40.0)

## 2018-03-02 NOTE — Patient Instructions (Addendum)

## 2018-03-31 DIAGNOSIS — N912 Amenorrhea, unspecified: Secondary | ICD-10-CM | POA: Diagnosis not present

## 2018-04-15 ENCOUNTER — Encounter (HOSPITAL_COMMUNITY): Payer: Self-pay

## 2018-04-15 ENCOUNTER — Other Ambulatory Visit (HOSPITAL_COMMUNITY)
Admission: AD | Admit: 2018-04-15 | Discharge: 2018-04-15 | Disposition: A | Discharge status: Home / Self Care | Payer: 59 | Source: Ambulatory Visit | Attending: Obstetrics and Gynecology | Admitting: Obstetrics and Gynecology

## 2018-04-15 DIAGNOSIS — Z029 Encounter for administrative examinations, unspecified: Secondary | ICD-10-CM | POA: Diagnosis not present

## 2018-04-15 DIAGNOSIS — N911 Secondary amenorrhea: Secondary | ICD-10-CM | POA: Diagnosis not present

## 2018-04-15 LAB — TYPE AND SCREEN
ABO/RH(D): A POS
Antibody Screen: NEGATIVE

## 2018-04-15 LAB — ABO/RH: ABO/RH(D): A POS

## 2018-04-20 ENCOUNTER — Encounter (HOSPITAL_COMMUNITY): Payer: Self-pay | Admitting: *Deleted

## 2018-04-22 ENCOUNTER — Ambulatory Visit (HOSPITAL_COMMUNITY): Payer: 59

## 2018-04-25 ENCOUNTER — Encounter (HOSPITAL_COMMUNITY): Payer: Self-pay

## 2018-04-25 ENCOUNTER — Ambulatory Visit (HOSPITAL_COMMUNITY)
Admission: RE | Admit: 2018-04-25 | Discharge: 2018-04-25 | Disposition: A | Discharge status: Home / Self Care | Payer: 59 | Source: Ambulatory Visit | Attending: Obstetrics and Gynecology | Admitting: Obstetrics and Gynecology

## 2018-04-25 DIAGNOSIS — Z3A08 8 weeks gestation of pregnancy: Secondary | ICD-10-CM | POA: Diagnosis not present

## 2018-04-25 DIAGNOSIS — Z955 Presence of coronary angioplasty implant and graft: Secondary | ICD-10-CM | POA: Diagnosis not present

## 2018-04-25 DIAGNOSIS — O99411 Diseases of the circulatory system complicating pregnancy, first trimester: Secondary | ICD-10-CM | POA: Diagnosis not present

## 2018-04-25 DIAGNOSIS — I2510 Atherosclerotic heart disease of native coronary artery without angina pectoris: Secondary | ICD-10-CM | POA: Insufficient documentation

## 2018-04-25 HISTORY — DX: Headache, unspecified: R51.9

## 2018-04-25 HISTORY — DX: Headache: R51

## 2018-04-25 NOTE — Consult Note (Signed)
CONSULT NOTE      MATERNAL FETAL MEDICINE CONSULT  Patient Name: Erin Morris  Medical Record Number: [id number] Date of Birth: 04-Dec-1985  Requesting Physician Name: Dr. Gaetano Net, M/D. Date of Service: 04/25/18  I had the pleasure of seeing Erin Morris today at the Center for maternal fetal [facility].  As you know she is a gravida 1 para 0 at [redacted] weeks gestation with past medical history of coronary artery disease.  She had  MFM consultation in March 2018 by Dr. Arnoldo Lenis.  Past medical history significant for total mid circumflex occlusion in 2010 that was diagnosed by cardiac catheterization following her complaints of chest pain.  She had base metal stent placed and was on Plavix for 6 months.  In 2015 she had one episode of severe chest pain following what she described as pulling the patient (she is a radiology technician was (.  Coronary angiography was performed and no occlusion was seen.  Patient has been having regular follow-up with her cardiologist.  Her most recent visit was last month and EKG was normal.  Review of systems: No history of shortness of breath or chest pain.  No headache or blurry vision or abdominal pain.  No joint pains.  No history of recurrent urinary infections or vaginal bleeding.  Patient has nausea and vomiting that is related to her early pregnancy.  She reports eating healthy meals and exercises regularly.  Before her first trimester she was exercising 3-4 times a week in elliptical and also doing weights.  She takes low-dose aspirin regularly.  She had discontinued simvastatin several months ago.   OB History: G1 P0.  GYN history: History of abnormal Pap smear.  She had LEEP last year.  No history of cold knife cone biopsy procedures.  Medical history: No history of hypertension or diabetes or any other chronic medical conditions.  Surgical history: Cardiac stent, left shoulder surgery (2008), tonsillectomy.  Medications: Prenatal vitamins, aspirin,  Zyrtec as needed, vitamin B6.  Allergies: No known drug allergies.  Social: Denies tobacco drug or alcohol use.  She is been married 2 years and her husband is in good health he is Caucasian.  Family: Paternal grandfather died of MI at age 15.  Father had cardiac ablation (arrhythmia from occasions taken for ulcerative colitis.  I counseled the patient on the following:  Coronary artery disease and pregnancy: I discussed the Cardiovascular physiological changes in pregnancy.  Plasma volume and heart rate and stroke-volume are increased in pregnancy.  Pregnancy imposes a slight additional burden to cardiovascular system.  There is normal increased shortness of breath in pregnancy.  Labor and second stage further increase the cardiac output and heart rate.  Given that she has stable cardiovascular condition, I would expect a lower likelihood of having maternal adverse outcome.  Should be able to tolerate the pregnancy well.  However there is a small slight increased risk of myocardial infarction in pregnancy that cannot be clearly quantified.  Patient understands a slight increased risk.  I advised her to continue aspirin and pregnancy that also reduces the likelihood of developing preeclampsia.  I informed her cardiac catheterization in pregnancy is not contraindicated (if the need arises).  I also encouraged her to take regular exercises.  History of LEEP: Some studies have noted a small increased risk for mid-trimester losses following LEEP.  I recommend cervical length measurement at anatomy scan. Overall, the likelihood of having cervical incompetence is very low.  Recommendations: -Follow-up with cardiologist in the second or  third trimester. -Continue low-dose aspirin throughout her pregnancy.  Thank you for your consult.  If you have any questions or concerns please do not hesitate to contact me at the Center for maternal fetal care.  Consultation including face-to-face counseling  30 minutes.

## 2018-04-28 DIAGNOSIS — Z3685 Encounter for antenatal screening for Streptococcus B: Secondary | ICD-10-CM | POA: Diagnosis not present

## 2018-04-28 DIAGNOSIS — Z3401 Encounter for supervision of normal first pregnancy, first trimester: Secondary | ICD-10-CM | POA: Diagnosis not present

## 2018-04-28 LAB — OB RESULTS CONSOLE ANTIBODY SCREEN: Antibody Screen: NEGATIVE

## 2018-04-28 LAB — OB RESULTS CONSOLE ABO/RH: RH Type: POSITIVE

## 2018-04-28 LAB — OB RESULTS CONSOLE RPR: RPR: NONREACTIVE

## 2018-04-28 LAB — OB RESULTS CONSOLE HIV ANTIBODY (ROUTINE TESTING): HIV: NONREACTIVE

## 2018-04-28 LAB — OB RESULTS CONSOLE HEPATITIS B SURFACE ANTIGEN: Hepatitis B Surface Ag: NEGATIVE

## 2018-04-28 LAB — OB RESULTS CONSOLE GC/CHLAMYDIA
Chlamydia: NEGATIVE
Gonorrhea: NEGATIVE

## 2018-04-28 LAB — OB RESULTS CONSOLE RUBELLA ANTIBODY, IGM: Rubella: IMMUNE

## 2018-05-02 MED FILL — BONJESTA 20-20 MG TAB: 20-20 | 30 days supply | Qty: 30 | Fill #0

## 2018-05-11 DIAGNOSIS — Z3480 Encounter for supervision of other normal pregnancy, unspecified trimester: Secondary | ICD-10-CM | POA: Diagnosis not present

## 2018-05-11 DIAGNOSIS — Z3401 Encounter for supervision of normal first pregnancy, first trimester: Secondary | ICD-10-CM | POA: Diagnosis not present

## 2018-05-11 DIAGNOSIS — Z113 Encounter for screening for infections with a predominantly sexual mode of transmission: Secondary | ICD-10-CM | POA: Diagnosis not present

## 2018-05-18 DIAGNOSIS — Z3A12 12 weeks gestation of pregnancy: Secondary | ICD-10-CM | POA: Diagnosis not present

## 2018-05-18 DIAGNOSIS — Z3682 Encounter for antenatal screening for nuchal translucency: Secondary | ICD-10-CM | POA: Diagnosis not present

## 2018-05-18 DIAGNOSIS — Z3481 Encounter for supervision of other normal pregnancy, first trimester: Secondary | ICD-10-CM | POA: Diagnosis not present

## 2018-05-25 ENCOUNTER — Other Ambulatory Visit: Payer: Self-pay

## 2018-06-14 ENCOUNTER — Encounter: Payer: Self-pay | Admitting: Family Medicine

## 2018-07-08 ENCOUNTER — Encounter (HOSPITAL_COMMUNITY): Payer: Self-pay

## 2018-07-08 DIAGNOSIS — Z3A19 19 weeks gestation of pregnancy: Secondary | ICD-10-CM | POA: Diagnosis not present

## 2018-07-08 DIAGNOSIS — Z363 Encounter for antenatal screening for malformations: Secondary | ICD-10-CM | POA: Diagnosis not present

## 2018-07-08 DIAGNOSIS — Z3480 Encounter for supervision of other normal pregnancy, unspecified trimester: Secondary | ICD-10-CM | POA: Diagnosis not present

## 2018-07-08 DIAGNOSIS — Z3400 Encounter for supervision of normal first pregnancy, unspecified trimester: Secondary | ICD-10-CM | POA: Diagnosis not present

## 2018-08-11 ENCOUNTER — Ambulatory Visit: Payer: 59 | Admitting: Family Medicine

## 2018-08-11 ENCOUNTER — Encounter: Payer: Self-pay | Admitting: Family Medicine

## 2018-08-11 VITALS — BP 126/84 | Temp 98.1°F | Ht 67.0 in | Wt 212.0 lb

## 2018-08-11 DIAGNOSIS — J329 Chronic sinusitis, unspecified: Secondary | ICD-10-CM | POA: Diagnosis not present

## 2018-08-11 DIAGNOSIS — J310 Chronic rhinitis: Secondary | ICD-10-CM

## 2018-08-11 MED ORDER — AMOXICILLIN 500 MG PO CAPS: 500.0000 mg | ORAL_CAPSULE | Freq: Three times a day (TID) | ORAL | 0 refills | Status: DC

## 2018-08-11 NOTE — Progress Notes (Signed)
   Subjective:    Patient ID: Erin Morris, female    DOB: Mar 06, 1986, 32 y.o.   MRN: [id number]  Pt is [redacted] weeks pregnant.   Sinusitis  This is a new problem. Episode onset: one week. (Wheezing, cough, sore throat, congestion, ) Treatments tried: coricidin cough and cold.   Started with sinus cong beginning of the week   chlorocetin not helping moucn  Cong and pressure up top  Throat uncomfortable intermittently.  Worse in the morning.  Painful    No fever noted, but felt warm  Frontal headache.  Sharp at times.  Achy at times worse with cough   Review of Systems  no major weight loss or weight gain, no chest pain no back pain abdominal pain no change in bowel habits complete ROS otherwise negative     Objective:   Physical Exam   Alert, mild malaise. Hydration good Vitals stable. frontal/ maxillary tenderness evident positive nasal congestion. pharynx normal neck supple  lungs clear/no crackles or wheezes. heart regular in rhythm      Assessment & Plan:  Impression rhinosinusitis likely post viral, discussed with patient. plan antibiotics prescribed. Questions answered. Symptomatic care discussed. warning signs discussed. WSL

## 2018-09-06 ENCOUNTER — Encounter: Payer: Self-pay | Admitting: Family Medicine

## 2018-09-06 DIAGNOSIS — Z3480 Encounter for supervision of other normal pregnancy, unspecified trimester: Secondary | ICD-10-CM | POA: Diagnosis not present

## 2018-09-06 DIAGNOSIS — Z23 Encounter for immunization: Secondary | ICD-10-CM | POA: Diagnosis not present

## 2018-09-06 DIAGNOSIS — Z3A28 28 weeks gestation of pregnancy: Secondary | ICD-10-CM | POA: Diagnosis not present

## 2018-09-06 DIAGNOSIS — Z3400 Encounter for supervision of normal first pregnancy, unspecified trimester: Secondary | ICD-10-CM | POA: Diagnosis not present

## 2018-09-06 DIAGNOSIS — O26893 Other specified pregnancy related conditions, third trimester: Secondary | ICD-10-CM | POA: Diagnosis not present

## 2018-09-15 NOTE — Progress Notes (Signed)
Cardiology Office Note  Date: 09/16/2018   ID: TAMANI DURNEY, DOB 1986/09/16, MRN [id number]  PCP: Mikey Kirschner, MD  Primary Cardiologist: [name], MD   Chief Complaint  Patient presents with  . Coronary Artery Disease    History of Present Illness: Erin Morris is a 32 y.o. female last seen in June.  She presents for a routine visit, overall doing well.  She is now in her third trimester of pregnancy, follows with Dr. Gaetano Net.  Still working full-time and expects to deliver sometime in late March.  From a cardiac perspective, she does not report any symptoms, specifically no angina or unusual shortness of breath, no palpitations or syncope.  She is taking a baby aspirin but has been off statin therapy since prior to pregnancy.  Her last lipid panel per Dr. Wolfgang Phoenix was in June with LDL 52.  Past Medical History:  Diagnosis Date  . Coronary atherosclerosis of native coronary artery    BMS to mid circumflex 2010  . Headache   . HPV in female   . NSTEMI (non-ST elevated myocardial infarction) ([place])    2010 - had prior workup for hypercoagulable state that was negative  . Vaginal Pap smear, abnormal     Past Surgical History:  Procedure Laterality Date  . addnoids    . LEFT HEART CATHETERIZATION WITH CORONARY ANGIOGRAM N/A 12/15/2013   Procedure: LEFT HEART CATHETERIZATION WITH CORONARY ANGIOGRAM;  Surgeon: Troy Sine, MD;  Location: [facility] CATH LAB;  Service: Cardiovascular;  Laterality: N/A;  . SHOULDER SURGERY Left   . TONSILLECTOMY    . WISDOM TOOTH EXTRACTION      Current Outpatient Medications  Medication Sig Dispense Refill  . aspirin 81 MG EC tablet Take 81 mg by mouth daily.      Marland Kitchen CETIRIZINE HCL ALLERGY CHILD PO Take 10 mg by mouth daily.    . nitroGLYCERIN (NITROSTAT) 0.4 MG SL tablet Place 0.4 mg under the tongue every 5 (five) minutes as needed for chest pain.    Marland Kitchen PRENATAL VIT-DOCUSATE-IRON-FA PO Take by mouth.     No current  facility-administered medications for this visit.    Allergies:  Nsaids   Social History: The patient  reports that she has never smoked. She has never used smokeless tobacco. She reports current alcohol use. She reports that she does not use drugs.   ROS:  Please see the history of present illness. Otherwise, complete review of systems is positive for none.  All other systems are reviewed and negative.   Physical Exam: VS:  BP 122/70   Pulse 92   Ht 5\' 7"  (1.702 m)   Wt 215 lb 9.6 oz (97.8 kg)   LMP 02/22/2018 (Exact Date)   SpO2 98%   BMI 33.77 kg/m , BMI Body mass index is 33.77 kg/m.  Wt Readings from Last 3 Encounters:  09/16/18 215 lb 9.6 oz (97.8 kg)  08/11/18 212 lb (96.2 kg)  04/25/18 192 lb (87.1 kg)    General: Patient appears comfortable at rest. HEENT: Conjunctiva and lids normal, oropharynx clear. Neck: Supple, no elevated JVP or carotid bruits, no thyromegaly. Lungs: Clear to auscultation, nonlabored breathing at rest. Cardiac: Regular rate and rhythm, no S3, soft systolic murmur. Abdomen: Gravid, bowel sounds present. Extremities: No pitting edema, distal pulses 2+.  ECG: I personally reviewed the tracing from 03/02/2018 which showed normal sinus rhythm.  Recent Labwork:    Component Value Date/Time   CHOL 100 03/02/2018 0718  TRIG 58.0 03/02/2018 0718   HDL 36.50 (L) 03/02/2018 0718   CHOLHDL 3 03/02/2018 0718   VLDL 11.6 03/02/2018 0718   LDLCALC 52 03/02/2018 0718    Other Studies Reviewed Today:  Cardiac catheterization 12/15/2013: HEMODYNAMICS:  Central Aorta: 86/51  Left Ventricle: 86/2  ANGIOGRAPHY:  The left main coronary artery was angiographically normal and bifurcated into the LAD and left circumflex vessel.  The left anterior descending artery was angiographically and extended to the LV apex.   The left circumflex was a large caliber vessel. This gave rise to a very high marginal branch. The proximal circumflex had a widely  patent stent extended into the second marginal branch. The stent was widely patent without evidence for restenosis or any residual narrowing.  The RCA was angiographically normal it gave rise to the PDA and small posterolateral branch.  Left ventricular function was in the low-normal range. Visualization was suboptimal to truly delineate any wall motion abnormality definitively.  Assessment and Plan:  Symptomatically stable CAD status post BMS to the circumflex in 2010.  Stent site was patent at angiography in 2015, and she is doing well at this time on a baby aspirin daily.  She has not been on statin therapy since prior to her current pregnancy.  She states that she is progressing well and anticipates delivery in late March.  Functional capacity remains good and I would not anticipate further cardiac testing at this time.  Current medicines were reviewed with the patient today.   Disposition: Follow-up in 6 months, sooner if needed.  Signed, Satira Sark, MD, [facility] 09/16/2018 2:20 PM    [facility] at [facility] [address], [place], [place] [address] Phone: [phone]; Fax: [phone]

## 2018-09-16 ENCOUNTER — Encounter: Payer: Self-pay | Admitting: Cardiology

## 2018-09-16 ENCOUNTER — Ambulatory Visit: Payer: 59 | Admitting: Cardiology

## 2018-09-16 VITALS — BP 122/70 | HR 92 | Ht 67.0 in | Wt 215.6 lb

## 2018-09-16 DIAGNOSIS — I2510 Atherosclerotic heart disease of native coronary artery without angina pectoris: Secondary | ICD-10-CM | POA: Diagnosis not present

## 2018-09-16 NOTE — Patient Instructions (Signed)
Medication Instructions:  Your physician recommends that you continue on your current medications as directed. Please refer to the Current Medication list given to you today.  If you need a refill on your cardiac medications before your next appointment, please call your pharmacy.   Lab work: None today If you have labs (blood work) drawn today and your tests are completely normal, you will receive your results only by: Marland Kitchen MyChart Message (if you have MyChart) OR . A paper copy in the mail If you have any lab test that is abnormal or we need to change your treatment, we will call you to review the results.  Testing/Procedures: None today  Follow-Up: At [facility], you and your health needs are our priority.  As part of our continuing mission to provide you with exceptional heart care, we have created designated Provider Care Teams.  These Care Teams include your primary Cardiologist (physician) and Advanced Practice Providers (APPs -  Physician Assistants and Nurse Practitioners) who all work together to provide you with the care you need, when you need it. You will need a follow up appointment in 6 months.  Please call our office 2 months in advance to schedule this appointment.  You may see [name], MD or one of the following Advanced Practice Providers on your designated Care Team:   Bernerd Pho, PA-C [facility]) . Ermalinda Barrios, PA-C ([place])  Any Other Special Instructions Will Be Listed Below (If Applicable). None    Thank you for choosing [facility] !

## 2018-09-21 NOTE — L&D Delivery Note (Signed)
Delivery Note At 12:01 PM a viable female was delivered via Vaginal, Spontaneous (Presentation: LOA).  APGAR: 8, 9; weight pending.   Placenta status: S, I. 3V Cord with the following complications: none.  Cord pH: n/a  Anesthesia:  CLEA Episiotomy: None Lacerations: Vaginal;Labial;Periurethral Suture Repair: 3.0 vicryl rapide Est. Blood Loss (mL): 500  Mom to postpartum.  Baby to Couplet care / Skin to Skin.  Linda Hedges 11/25/2018, 1:13 PM

## 2018-10-04 DIAGNOSIS — O3663X0 Maternal care for excessive fetal growth, third trimester, not applicable or unspecified: Secondary | ICD-10-CM | POA: Diagnosis not present

## 2018-10-04 DIAGNOSIS — Z3A32 32 weeks gestation of pregnancy: Secondary | ICD-10-CM | POA: Diagnosis not present

## 2018-10-04 DIAGNOSIS — Z3480 Encounter for supervision of other normal pregnancy, unspecified trimester: Secondary | ICD-10-CM | POA: Diagnosis not present

## 2018-10-04 DIAGNOSIS — Z3493 Encounter for supervision of normal pregnancy, unspecified, third trimester: Secondary | ICD-10-CM | POA: Diagnosis not present

## 2018-10-06 ENCOUNTER — Ambulatory Visit (HOSPITAL_BASED_OUTPATIENT_CLINIC_OR_DEPARTMENT_OTHER)
Admission: RE | Admit: 2018-10-06 | Discharge: 2018-10-06 | Disposition: A | Discharge status: Home / Self Care | Payer: 59 | Source: Ambulatory Visit | Attending: Obstetrics and Gynecology | Admitting: Obstetrics and Gynecology

## 2018-10-06 ENCOUNTER — Other Ambulatory Visit: Payer: Self-pay

## 2018-10-06 ENCOUNTER — Encounter (HOSPITAL_COMMUNITY): Payer: Self-pay

## 2018-10-06 ENCOUNTER — Ambulatory Visit (HOSPITAL_COMMUNITY)
Admission: RE | Admit: 2018-10-06 | Discharge: 2018-10-06 | Disposition: A | Discharge status: Home / Self Care | Payer: 59 | Source: Ambulatory Visit | Attending: Obstetrics and Gynecology | Admitting: Obstetrics and Gynecology

## 2018-10-06 ENCOUNTER — Other Ambulatory Visit (HOSPITAL_COMMUNITY): Payer: Self-pay | Admitting: *Deleted

## 2018-10-06 DIAGNOSIS — Z363 Encounter for antenatal screening for malformations: Secondary | ICD-10-CM | POA: Insufficient documentation

## 2018-10-06 DIAGNOSIS — O09893 Supervision of other high risk pregnancies, third trimester: Secondary | ICD-10-CM

## 2018-10-06 DIAGNOSIS — O09891 Supervision of other high risk pregnancies, first trimester: Secondary | ICD-10-CM | POA: Insufficient documentation

## 2018-10-06 DIAGNOSIS — O2693 Pregnancy related conditions, unspecified, third trimester: Secondary | ICD-10-CM | POA: Diagnosis not present

## 2018-10-06 DIAGNOSIS — Z3A32 32 weeks gestation of pregnancy: Secondary | ICD-10-CM | POA: Insufficient documentation

## 2018-10-06 NOTE — Consult Note (Signed)
Maternal-Fetal Medicine  Name: Erin Morris MRN: [id number] Requesting Provider: Everlene Farrier, MD  [name], G1 P0 at 32-weeks' gestation, returned for follow-up consultation. She has concerns about possible effects on indirect exposure to benzene products. 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 Two of his co-workers' wives lost their pregnancies (40 weeks and mid-trimester) recently, and the couple's concern is whether their nature of work and exposure to benzene has anything to with their losses. Her husband accompanied the patient today and he informed that he changes his clothes and shoes when he gets home. He also has protective barriers while handling the chemical  Patient reports good fetal movements. Her prenatal course has been uneventful. On cell-free fetal DNA screening, the risks of fetal aneuploidies are not increased. She does not have gestational diabetes. Her blood pressures have been normal at prenatal visits.  Of note, she had a coronary stent placed in 2010 following total mid-circumflex occlusion. Currently, she takes only low-dose aspirin and had discontinued atorvastatin in pregnancy. She also takes Pepcid 20 mg daily. Patient does not have any cardiovascular symptoms and she had recent cardiology consultation.  On today's ultrasound, amniotic fluid is normal and good fetal activity is seen. Fetal growth is appropriate for gestational age. Fetal anatomy appears normal, but limited by advanced gestational age.  I reassured the couple of ultrasound findings.   Exposure to chemicals: Studies show only direct prolonged benzene exposure (painters, deal with petroleum or solvents containing benzene) may be associated with increased risk of childhood leukemia. Stillbirth rates are low. In our patient, there is no direct exposure and her husband who handles the chemicals uses protective barriers and is unlikely  to be affected himself.  I reassured the couple that I do not see any risk factors and the pregnancy losses with his co-workers may have different causes and not related to their work. In addition, the patient does not have any high-risk pregnancy issues and we would expect good pregnancy outcomes. I briefly discussed fetal movements and kick counts.  Fetal growth assessment may be performed at your office in 4 weeks for reassurance.  Thank you for your consult. Please do not hesitate to contact me if you have any questions or concerns.  Consultation including face-to-face counseling: 30 min.

## 2018-10-07 DIAGNOSIS — H04123 Dry eye syndrome of bilateral lacrimal glands: Secondary | ICD-10-CM | POA: Diagnosis not present

## 2018-10-07 DIAGNOSIS — H52223 Regular astigmatism, bilateral: Secondary | ICD-10-CM | POA: Diagnosis not present

## 2018-10-07 DIAGNOSIS — H5213 Myopia, bilateral: Secondary | ICD-10-CM | POA: Diagnosis not present

## 2018-10-14 DIAGNOSIS — Z3482 Encounter for supervision of other normal pregnancy, second trimester: Secondary | ICD-10-CM | POA: Diagnosis not present

## 2018-10-14 DIAGNOSIS — Z3483 Encounter for supervision of other normal pregnancy, third trimester: Secondary | ICD-10-CM | POA: Diagnosis not present

## 2018-11-01 DIAGNOSIS — O139 Gestational [pregnancy-induced] hypertension without significant proteinuria, unspecified trimester: Secondary | ICD-10-CM | POA: Diagnosis not present

## 2018-11-01 DIAGNOSIS — Z3685 Encounter for antenatal screening for Streptococcus B: Secondary | ICD-10-CM | POA: Diagnosis not present

## 2018-11-23 ENCOUNTER — Telehealth (HOSPITAL_COMMUNITY): Payer: Self-pay | Admitting: *Deleted

## 2018-11-23 ENCOUNTER — Other Ambulatory Visit (HOSPITAL_COMMUNITY): Payer: Self-pay | Admitting: *Deleted

## 2018-11-23 ENCOUNTER — Encounter (HOSPITAL_COMMUNITY): Payer: Self-pay | Admitting: *Deleted

## 2018-11-23 LAB — OB RESULTS CONSOLE GBS: STREP GROUP B AG: NEGATIVE

## 2018-11-23 NOTE — H&P (Signed)
Erin Morris is a 33 y.o. female presenting for IOL for elective reasons.  Pregnancy complicated by hx of MI with stent but full cardiac clearance for  Normal labor without intervention.  She is on baby asa.  GBS-.  MFM has comanaged. OB History    Gravida  1   Para  0   Term  0   Preterm  0   AB  0   Living  0     SAB  0   TAB  0   Ectopic  0   Multiple  0   Live Births  0          Past Medical History:  Diagnosis Date  . Coronary atherosclerosis of native coronary artery    BMS to mid circumflex 2010  . Headache   . HPV in female   . NSTEMI (non-ST elevated myocardial infarction) ([place])    2010 - had prior workup for hypercoagulable state that was negative  . Vaginal Pap smear, abnormal    Past Surgical History:  Procedure Laterality Date  . addnoids    . COLPOSCOPY    . LEFT HEART CATHETERIZATION WITH CORONARY ANGIOGRAM N/A 12/15/2013   Procedure: LEFT HEART CATHETERIZATION WITH CORONARY ANGIOGRAM;  Surgeon: Troy Sine, MD;  Location: [facility] CATH LAB;  Service: Cardiovascular;  Laterality: N/A;  . SHOULDER SURGERY Left   . TONSILLECTOMY    . WISDOM TOOTH EXTRACTION     Family History: family history includes Colon cancer in her paternal grandmother; Heart disease in her paternal grandfather; Thyroid disease in her mother. Social History:  reports that she has never smoked. She has never used smokeless tobacco. She reports current alcohol use. She reports that she does not use drugs.     Maternal Diabetes: No Genetic Screening: Normal Maternal Ultrasounds/Referrals: Normal Fetal Ultrasounds or other Referrals:  None Maternal Substance Abuse:  No Significant Maternal Medications:  None Significant Maternal Lab Results:  None Other Comments:  None  ROS History   Last menstrual period 02/22/2018. Exam Physical Exam  Prenatal labs: ABO, Rh: A/Positive/-- (08/08 0000) Antibody: Negative (08/08 0000) Rubella: Immune (08/08 0000) RPR: Nonreactive  (08/08 0000)  HBsAg: Negative (08/08 0000)  HIV: Non-reactive (08/08 0000)  GBS: Negative (03/04 0000)   Assessment/Plan: IUP at 39 weeks Desires IOL Plan cytotec, then AROM and pitocin prn. Anticipate NSVD    Luz Lex 11/23/2018, 9:57 PM

## 2018-11-23 NOTE — Telephone Encounter (Signed)
Preadmission screen  

## 2018-11-24 ENCOUNTER — Inpatient Hospital Stay (HOSPITAL_COMMUNITY): Payer: 59 | Admitting: Anesthesiology

## 2018-11-24 ENCOUNTER — Inpatient Hospital Stay (HOSPITAL_COMMUNITY): Payer: 59

## 2018-11-24 ENCOUNTER — Other Ambulatory Visit: Payer: Self-pay

## 2018-11-24 ENCOUNTER — Inpatient Hospital Stay (HOSPITAL_COMMUNITY)
Admission: AD | Admit: 2018-11-24 | Discharge: 2018-11-27 | DRG: 807 | Disposition: A | Discharge status: Home / Self Care | Payer: 59 | Attending: Obstetrics & Gynecology | Admitting: Obstetrics & Gynecology

## 2018-11-24 DIAGNOSIS — I252 Old myocardial infarction: Secondary | ICD-10-CM | POA: Diagnosis not present

## 2018-11-24 DIAGNOSIS — Z3490 Encounter for supervision of normal pregnancy, unspecified, unspecified trimester: Secondary | ICD-10-CM | POA: Diagnosis present

## 2018-11-24 DIAGNOSIS — O26893 Other specified pregnancy related conditions, third trimester: Secondary | ICD-10-CM | POA: Diagnosis present

## 2018-11-24 DIAGNOSIS — Z3A39 39 weeks gestation of pregnancy: Secondary | ICD-10-CM | POA: Diagnosis not present

## 2018-11-24 DIAGNOSIS — Z955 Presence of coronary angioplasty implant and graft: Secondary | ICD-10-CM | POA: Diagnosis not present

## 2018-11-24 LAB — CBC
HCT: 37.5 % (ref 36.0–46.0)
Hemoglobin: 11.9 g/dL — ABNORMAL LOW (ref 12.0–15.0)
MCH: 30.8 pg (ref 26.0–34.0)
MCHC: 31.7 g/dL (ref 30.0–36.0)
MCV: 97.2 fL (ref 80.0–100.0)
Platelets: 191 10*3/uL (ref 150–400)
RBC: 3.86 MIL/uL — ABNORMAL LOW (ref 3.87–5.11)
RDW: 12.8 % (ref 11.5–15.5)
WBC: 8.5 10*3/uL (ref 4.0–10.5)
nRBC: 0 % (ref 0.0–0.2)

## 2018-11-24 LAB — RPR: RPR Ser Ql: NONREACTIVE

## 2018-11-24 LAB — TYPE AND SCREEN
ABO/RH(D): A POS
Antibody Screen: NEGATIVE

## 2018-11-24 LAB — ABO/RH: ABO/RH(D): A POS

## 2018-11-24 MED ORDER — FENTANYL-BUPIVACAINE-NACL 0.5-0.125-0.9 MG/250ML-% EP SOLN
12.0000 mL/h | EPIDURAL | Status: DC | PRN
  Administered 2018-11-25: 12 mL/h via EPIDURAL
  Filled 2018-11-24 (×3): qty 250

## 2018-11-24 MED ORDER — PHENYLEPHRINE 40 MCG/ML (10ML) SYRINGE FOR IV PUSH (FOR BLOOD PRESSURE SUPPORT)
80.0000 ug | PREFILLED_SYRINGE | INTRAVENOUS | Status: DC | PRN
  Filled 2018-11-24: qty 10

## 2018-11-24 MED ORDER — OXYTOCIN BOLUS FROM INFUSION
500.0000 mL | Freq: Once | INTRAVENOUS | Status: AC
  Administered 2018-11-25: 500 mL via INTRAVENOUS

## 2018-11-24 MED ORDER — DIPHENHYDRAMINE HCL 50 MG/ML IJ SOLN
12.5000 mg | INTRAMUSCULAR | Status: DC | PRN
  Administered 2018-11-25: 12.5 mg via INTRAVENOUS
  Filled 2018-11-24: qty 1

## 2018-11-24 MED ORDER — LACTATED RINGERS IV SOLN
INTRAVENOUS | Status: DC
  Administered 2018-11-24 – 2018-11-25 (×4): via INTRAVENOUS

## 2018-11-24 MED ORDER — EPHEDRINE 5 MG/ML INJ: 10.0000 mg | INTRAVENOUS | Status: DC | PRN

## 2018-11-24 MED ORDER — OXYCODONE-ACETAMINOPHEN 5-325 MG PO TABS: 2.0000 | ORAL_TABLET | ORAL | Status: DC | PRN

## 2018-11-24 MED ORDER — PHENYLEPHRINE 40 MCG/ML (10ML) SYRINGE FOR IV PUSH (FOR BLOOD PRESSURE SUPPORT)
80.0000 ug | PREFILLED_SYRINGE | INTRAVENOUS | Status: DC | PRN
  Administered 2018-11-24: 80 ug via INTRAVENOUS

## 2018-11-24 MED ORDER — OXYCODONE-ACETAMINOPHEN 5-325 MG PO TABS: 1.0000 | ORAL_TABLET | ORAL | Status: DC | PRN

## 2018-11-24 MED ORDER — LACTATED RINGERS IV SOLN: 500.0000 mL | INTRAVENOUS | Status: DC | PRN

## 2018-11-24 MED ORDER — SOD CITRATE-CITRIC ACID 500-334 MG/5ML PO SOLN: 30.0000 mL | ORAL | Status: DC | PRN

## 2018-11-24 MED ORDER — SODIUM CHLORIDE (PF) 0.9 % IJ SOLN
INTRAMUSCULAR | Status: DC | PRN
  Administered 2018-11-24: 12 mL/h via EPIDURAL

## 2018-11-24 MED ORDER — LIDOCAINE HCL (PF) 1 % IJ SOLN
30.0000 mL | INTRAMUSCULAR | Status: AC | PRN
  Administered 2018-11-25: 30 mL via SUBCUTANEOUS
  Filled 2018-11-24: qty 30

## 2018-11-24 MED ORDER — TERBUTALINE SULFATE 1 MG/ML IJ SOLN: 0.2500 mg | Freq: Once | INTRAMUSCULAR | Status: DC | PRN

## 2018-11-24 MED ORDER — MISOPROSTOL 25 MCG QUARTER TABLET
25.0000 ug | ORAL_TABLET | ORAL | Status: DC
  Administered 2018-11-24 (×2): 25 ug via VAGINAL
  Filled 2018-11-24 (×2): qty 1

## 2018-11-24 MED ORDER — OXYTOCIN 40 UNITS IN NORMAL SALINE INFUSION - SIMPLE MED
2.5000 [IU]/h | INTRAVENOUS | Status: DC
  Filled 2018-11-24: qty 1000

## 2018-11-24 MED ORDER — LACTATED RINGERS IV SOLN
500.0000 mL | Freq: Once | INTRAVENOUS | Status: AC
  Administered 2018-11-24: 500 mL via INTRAVENOUS

## 2018-11-24 MED ORDER — ONDANSETRON HCL 4 MG/2ML IJ SOLN: 4.0000 mg | Freq: Four times a day (QID) | INTRAMUSCULAR | Status: DC | PRN

## 2018-11-24 MED ORDER — LIDOCAINE HCL (PF) 1 % IJ SOLN
INTRAMUSCULAR | Status: DC | PRN
  Administered 2018-11-24 (×2): 5 mL via EPIDURAL

## 2018-11-24 MED ORDER — ZOLPIDEM TARTRATE 5 MG PO TABS: 5.0000 mg | ORAL_TABLET | Freq: Every evening | ORAL | Status: DC | PRN

## 2018-11-24 MED ORDER — BUTORPHANOL TARTRATE 1 MG/ML IJ SOLN: 1.0000 mg | INTRAMUSCULAR | Status: DC | PRN

## 2018-11-24 MED ORDER — ACETAMINOPHEN 325 MG PO TABS: 650.0000 mg | ORAL_TABLET | ORAL | Status: DC | PRN

## 2018-11-24 MED ORDER — OXYTOCIN 40 UNITS IN NORMAL SALINE INFUSION - SIMPLE MED
1.0000 m[IU]/min | INTRAVENOUS | Status: DC
  Administered 2018-11-24: 2 m[IU]/min via INTRAVENOUS
  Filled 2018-11-24: qty 1000

## 2018-11-24 NOTE — Anesthesia Preprocedure Evaluation (Signed)
Anesthesia Evaluation  Patient identified by MRN, date of birth, ID band Patient awake    Reviewed: Allergy & Precautions, H&P , NPO status , Patient's Chart, lab work & pertinent test results  History of Anesthesia Complications Negative for: history of anesthetic complications  Airway Mallampati: II  TM Distance: >3 FB Neck ROM: full    Dental no notable dental hx. (+) Teeth Intact   Pulmonary neg pulmonary ROS,    Pulmonary exam normal breath sounds clear to auscultation       Cardiovascular + CAD, + Past MI and + Cardiac Stents  Normal cardiovascular exam Rhythm:regular Rate:Normal     Neuro/Psych negative neurological ROS  negative psych ROS   GI/Hepatic negative GI ROS, Neg liver ROS,   Endo/Other  negative endocrine ROS  Renal/GU negative Renal ROS  negative genitourinary   Musculoskeletal   Abdominal   Peds  Hematology negative hematology ROS (+)   Anesthesia Other Findings   Reproductive/Obstetrics (+) Pregnancy                             Anesthesia Physical Anesthesia Plan  ASA: II  Anesthesia Plan: Epidural   Post-op Pain Management:    Induction:   PONV Risk Score and Plan:   Airway Management Planned:   Additional Equipment:   Intra-op Plan:   Post-operative Plan:   Informed Consent: I have reviewed the patients History and Physical, chart, labs and discussed the procedure including the risks, benefits and alternatives for the proposed anesthesia with the patient or authorized representative who has indicated his/her understanding and acceptance.       Plan Discussed with:   Anesthesia Plan Comments:         Anesthesia Quick Evaluation

## 2018-11-24 NOTE — Anesthesia Procedure Notes (Signed)
Epidural Patient location during procedure: OB  Staffing Anesthesiologist: Lashara Urey, MD Performed: anesthesiologist   Preanesthetic Checklist Completed: patient identified, site marked, surgical consent, pre-op evaluation, timeout performed, IV checked, risks and benefits discussed and monitors and equipment checked  Epidural Patient position: sitting Prep: DuraPrep Patient monitoring: heart rate, continuous pulse ox and blood pressure Approach: right paramedian Location: L3-L4 Injection technique: LOR saline  Needle:  Needle type: Tuohy  Needle gauge: 17 G Needle length: 9 cm and 9 Needle insertion depth: 6 cm Catheter type: closed end flexible Catheter size: 20 Guage Catheter at skin depth: 10 cm Test dose: negative  Assessment Events: blood not aspirated, injection not painful, no injection resistance, negative IV test and no paresthesia  Additional Notes Patient identified. Risks/Benefits/Options discussed with patient including but not limited to bleeding, infection, nerve damage, paralysis, failed block, incomplete pain control, headache, blood pressure changes, nausea, vomiting, reactions to medication both or allergic, itching and postpartum back pain. Confirmed with bedside nurse the patient's most recent platelet count. Confirmed with patient that they are not currently taking any anticoagulation, have any bleeding history or any family history of bleeding disorders. Patient expressed understanding and wished to proceed. All questions were answered. Sterile technique was used throughout the entire procedure. Please see nursing notes for vital signs. Test dose was given through epidural needle and negative prior to continuing to dose epidural or start infusion. Warning signs of high block given to the patient including shortness of breath, tingling/numbness in hands, complete motor block, or any concerning symptoms with instructions to call for help. Patient was given  instructions on fall risk and not to get out of bed. All questions and concerns addressed with instructions to call with any issues.     

## 2018-11-24 NOTE — Anesthesia Pain Management Evaluation Note (Signed)
  CRNA Pain Management Visit Note  Patient: Erin Morris, 33 y.o., female  "Hello I am a member of the anesthesia team at [facility] and [place]. We have an anesthesia team available at all times to provide care throughout the hospital, including epidural management and anesthesia for C-section. I don't know your plan for the delivery whether it a natural birth, water birth, IV sedation, nitrous supplementation, doula or epidural, but we want to meet your pain goals."   1.Was your pain managed to your expectations on prior hospitalizations?   No prior hospitalizations  2.What is your expectation for pain management during this hospitalization?     Epidural  3.How can we help you reach that goal? Epidural infusing, patient comfortable  Record the patient's initial score and the patient's pain goal.   Pain: 3  Pain Goal: 6 [facility] [place] wants you to be able to say your pain was always managed very well.  [facility] 11/24/2018

## 2018-11-24 NOTE — Progress Notes (Signed)
Patient ID: Erin Morris, female   DOB: 12/01/1985, 33 y.o.   MRN: [id number] Pt with mild ctxs VSSAF FHR 140s cat 1 Ctx q 2-4  Cx 2/50/-2 AROM clear  IOL at term AROM and pit prn. Anticipate SVD

## 2018-11-24 NOTE — Anesthesia Pain Management Evaluation Note (Signed)
  CRNA Pain Management Visit Note  Patient: Erin Morris, 33 y.o., female  "Hello I am a member of the anesthesia team at [facility] and [place]. We have an anesthesia team available at all times to provide care throughout the hospital, including epidural management and anesthesia for C-section. I don't know your plan for the delivery whether it a natural birth, water birth, IV sedation, nitrous supplementation, doula or epidural, but we want to meet your pain goals."   1.Was your pain managed to your expectations on prior hospitalizations?   Yes   2.What is your expectation for pain management during this hospitalization?     Epidural  3.How can we help you reach that goal?epidural  Record the patient's initial score and the patient's pain goal.   Pain: 10/10 Pain Goal: 1/10 [facility] [place] wants you to be able to say your pain was always managed very well.  Ailene Ards 11/24/2018

## 2018-11-25 ENCOUNTER — Encounter (HOSPITAL_COMMUNITY): Payer: Self-pay | Admitting: *Deleted

## 2018-11-25 ENCOUNTER — Other Ambulatory Visit: Payer: Self-pay

## 2018-11-25 DIAGNOSIS — Z3490 Encounter for supervision of normal pregnancy, unspecified, unspecified trimester: Secondary | ICD-10-CM

## 2018-11-25 MED ORDER — MISOPROSTOL 200 MCG PO TABS
ORAL_TABLET | ORAL | Status: AC
  Administered 2018-11-25: 1000 ug
  Filled 2018-11-25: qty 5

## 2018-11-25 MED ORDER — MISOPROSTOL 200 MCG PO TABS: 1000.0000 ug | ORAL_TABLET | Freq: Once | ORAL | Status: DC

## 2018-11-25 MED ORDER — SIMETHICONE 80 MG PO CHEW: 80.0000 mg | CHEWABLE_TABLET | ORAL | Status: DC | PRN

## 2018-11-25 MED ORDER — OXYTOCIN 10 UNIT/ML IJ SOLN: 10.0000 [IU] | Freq: Once | INTRAMUSCULAR | Status: DC

## 2018-11-25 MED ORDER — ASPIRIN EC 81 MG PO TBEC
81.0000 mg | DELAYED_RELEASE_TABLET | Freq: Every day | ORAL | Status: DC
  Administered 2018-11-26 – 2018-11-27 (×2): 81 mg via ORAL
  Filled 2018-11-25 (×2): qty 1

## 2018-11-25 MED ORDER — OXYTOCIN 10 UNIT/ML IJ SOLN
INTRAMUSCULAR | Status: AC
  Filled 2018-11-25: qty 1

## 2018-11-25 MED ORDER — ONDANSETRON HCL 4 MG PO TABS: 4.0000 mg | ORAL_TABLET | ORAL | Status: DC | PRN

## 2018-11-25 MED ORDER — FENTANYL-BUPIVACAINE-NACL 0.5-0.125-0.9 MG/250ML-% EP SOLN: 12.0000 mL/h | EPIDURAL | Status: DC | PRN

## 2018-11-25 MED ORDER — ZOLPIDEM TARTRATE 5 MG PO TABS: 5.0000 mg | ORAL_TABLET | Freq: Every evening | ORAL | Status: DC | PRN

## 2018-11-25 MED ORDER — TETANUS-DIPHTH-ACELL PERTUSSIS 5-2.5-18.5 LF-MCG/0.5 IM SUSP: 0.5000 mL | Freq: Once | INTRAMUSCULAR | Status: DC

## 2018-11-25 MED ORDER — OXYCODONE-ACETAMINOPHEN 5-325 MG PO TABS: 1.0000 | ORAL_TABLET | ORAL | Status: DC | PRN

## 2018-11-25 MED ORDER — DIBUCAINE 1 % RE OINT: 1.0000 "application " | TOPICAL_OINTMENT | RECTAL | Status: DC | PRN

## 2018-11-25 MED ORDER — NITROGLYCERIN 0.4 MG SL SUBL: 0.4000 mg | SUBLINGUAL_TABLET | SUBLINGUAL | Status: DC | PRN

## 2018-11-25 MED ORDER — WITCH HAZEL-GLYCERIN EX PADS: 1.0000 "application " | MEDICATED_PAD | CUTANEOUS | Status: DC | PRN

## 2018-11-25 MED ORDER — PRENATAL MULTIVITAMIN CH
1.0000 | ORAL_TABLET | Freq: Every day | ORAL | Status: DC
  Administered 2018-11-26: 1 via ORAL
  Filled 2018-11-25: qty 1

## 2018-11-25 MED ORDER — DIPHENHYDRAMINE HCL 25 MG PO CAPS: 25.0000 mg | ORAL_CAPSULE | Freq: Four times a day (QID) | ORAL | Status: DC | PRN

## 2018-11-25 MED ORDER — ONDANSETRON HCL 4 MG/2ML IJ SOLN: 4.0000 mg | INTRAMUSCULAR | Status: DC | PRN

## 2018-11-25 MED ORDER — BENZOCAINE-MENTHOL 20-0.5 % EX AERO
1.0000 "application " | INHALATION_SPRAY | CUTANEOUS | Status: DC | PRN
  Administered 2018-11-25: 1 via TOPICAL
  Filled 2018-11-25: qty 56

## 2018-11-25 MED ORDER — COCONUT OIL OIL
1.0000 "application " | TOPICAL_OIL | Status: DC | PRN
  Administered 2018-11-26: 1 via TOPICAL

## 2018-11-25 MED ORDER — OXYCODONE-ACETAMINOPHEN 5-325 MG PO TABS: 2.0000 | ORAL_TABLET | ORAL | Status: DC | PRN

## 2018-11-25 MED ORDER — SENNOSIDES-DOCUSATE SODIUM 8.6-50 MG PO TABS
2.0000 | ORAL_TABLET | ORAL | Status: DC
  Administered 2018-11-25 – 2018-11-26 (×2): 2 via ORAL
  Filled 2018-11-25 (×2): qty 2

## 2018-11-25 MED ORDER — ACETAMINOPHEN 325 MG PO TABS
650.0000 mg | ORAL_TABLET | ORAL | Status: DC | PRN
  Administered 2018-11-25 – 2018-11-26 (×5): 650 mg via ORAL
  Filled 2018-11-25 (×7): qty 2

## 2018-11-25 NOTE — Lactation Note (Signed)
This note was copied from a baby's chart. Lactation Consultation Note  Patient Name: Erin Morris [id number] Date: 11/25/2018 Reason for consult: Initial assessment;Term;Primapara;1st time breastfeeding  P1 mother whose infant is now 28 hours old.  Baby was being examined when I arrived.  Offered to assist with latching and mother accepted.  Positioned mother appropriately and assisted baby to latch in the football hold.  She had a wide gape and flanged lips.  With gentle stimulation she began sucking.  Demonstrated breast compressions.  Discussed basic breast feeding concepts while observing baby feed for 11 minutes.  Mother denied pain with latching and was happy to see baby feeding well.  Encouraged to feed 8-12 times/24 hours or sooner if baby shows cues.  Reviewed feeding cues.  Mother is familiar with hand expression and will hand express before/after feedings to help increase milk supply.  She will feed back any EBM she obtains with hand expression.  Mother will return to work after leave and already has a DEBP for home use.  Mom made aware of O/P services, breastfeeding support groups, community resources, and our phone # for post-discharge questions.   Mother will call for latch assistance as needed.  Father present and supportive.   Maternal Data Formula Feeding for Exclusion: No Has patient been taught Hand Expression?: Yes Does the patient have breastfeeding experience prior to this delivery?: No  Feeding Feeding Type: Breast Fed  LATCH Score Latch: Grasps breast easily, tongue down, lips flanged, rhythmical sucking.  Audible Swallowing: A few with stimulation  Type of Nipple: Everted at rest and after stimulation  Comfort (Breast/Nipple): Soft / non-tender  Hold (Positioning): Assistance needed to correctly position infant at breast and maintain latch.  LATCH Score: 8  Interventions Interventions: Breast feeding basics reviewed;Assisted with latch;Skin to  skin;Breast massage;Hand express;Breast compression;Position options;Support pillows;Adjust position  Lactation Tools Discussed/Used WIC Program: No   Consult Status Consult Status: Follow-up Date: 11/26/18 Follow-up type: In-patient    Little Ishikawa 11/25/2018, 5:39 PM

## 2018-11-25 NOTE — Progress Notes (Signed)
Erin Morris is a 33 y.o. G1P0000 at [redacted]w[redacted]d by ultrasound admitted for induction of labor due to Elective at term.  Subjective: Feeling pelvic pressure with CTX  Objective: BP 116/66   Pulse (!) 107   Temp 98.4 F (36.9 C)   Resp 17   Ht 5\' 7"  (1.702 m)   Wt 102.5 kg   LMP 02/22/2018 (Exact Date)   BMI 35.40 kg/m  I/O last 3 completed shifts: In: -  Out: 875 [Urine:875] No intake/output data recorded.  FHT:  FHR: 120 bpm, variability: moderate,  accelerations:  Present,  decelerations:  Absent UC:   regular, every 2 minutes SVE:   Dilation: Lip/rim Effacement (%): 70, 80 Station: -1 Exam by:: m wilkins rnc  Labs: Lab Results  Component Value Date   WBC 8.5 11/24/2018   HGB 11.9 (L) 11/24/2018   HCT 37.5 11/24/2018   MCV 97.2 11/24/2018   PLT 191 11/24/2018    Assessment / Plan: Protracted active phase  Labor: Continue pitocin; recheck in 30-45 minutes and begin pushing Preeclampsia:  n/a Fetal Wellbeing:  Category I Pain Control:  Epidural I/D:  n/a Anticipated MOD:  NSVD  [name] 11/25/2018, 9:11 AM

## 2018-11-25 NOTE — Anesthesia Postprocedure Evaluation (Signed)
Anesthesia Post Note  Patient: Erin Morris  Procedure(s) Performed: AN AD HOC LABOR EPIDURAL     Patient location during evaluation: Mother Baby Anesthesia Type: Epidural Level of consciousness: awake and alert Pain management: pain level controlled Vital Signs Assessment: post-procedure vital signs reviewed and stable Respiratory status: spontaneous breathing, nonlabored ventilation and respiratory function stable Cardiovascular status: stable Postop Assessment: no headache, no backache, epidural receding, no apparent nausea or vomiting, patient able to bend at knees, able to ambulate and adequate PO intake Anesthetic complications: no    Last Vitals:  Vitals:   11/25/18 1316 11/25/18 1331  BP: 119/69 122/70  Pulse: (!) 107 99  Resp:    Temp:      Last Pain:  Vitals:   11/25/18 0501  TempSrc: Oral  PainSc:    Pain Goal:                   Jabier Mutton

## 2018-11-26 LAB — CBC
HCT: 30.1 % — ABNORMAL LOW (ref 36.0–46.0)
Hemoglobin: 9.8 g/dL — ABNORMAL LOW (ref 12.0–15.0)
MCH: 31 pg (ref 26.0–34.0)
MCHC: 32.6 g/dL (ref 30.0–36.0)
MCV: 95.3 fL (ref 80.0–100.0)
Platelets: 162 10*3/uL (ref 150–400)
RBC: 3.16 MIL/uL — ABNORMAL LOW (ref 3.87–5.11)
RDW: 12.8 % (ref 11.5–15.5)
WBC: 15.3 10*3/uL — ABNORMAL HIGH (ref 4.0–10.5)
nRBC: 0 % (ref 0.0–0.2)

## 2018-11-26 NOTE — Progress Notes (Signed)
Post Partum Day 1 Subjective: no complaints, up ad lib, voiding and tolerating PO  Objective: Blood pressure 119/62, pulse 85, temperature (!) 97.4 F (36.3 C), temperature source Oral, resp. rate 19, height 5\' 7"  (1.702 m), weight 102.5 kg, last menstrual period 02/22/2018, SpO2 97 %, unknown if currently breastfeeding.  Physical Exam:  General: alert, cooperative and appears stated age Lochia: appropriate Uterine Fundus: firm Incision: healing well, no significant drainage, no dehiscence DVT Evaluation: No evidence of DVT seen on physical exam. Negative Homan's sign. No cords or calf tenderness.  Recent Labs    11/24/18 0053 11/26/18 0548  HGB 11.9* 9.8*  HCT 37.5 30.1*    Assessment/Plan: Plan for discharge tomorrow and Breastfeeding   LOS: 2 days   Linda Hedges 11/26/2018, 11:18 AM

## 2018-11-26 NOTE — Lactation Note (Signed)
This note was copied from a baby's chart. Lactation Consultation Note: Mom reports that baby has just finished feeding for 15 min. She is asleep in mom's arms. Reports no pain with latch. Asking about pumping and bottle feeding EBM- reviewed with mom to wait a few Aubra Pappalardo until baby is good at nursing. Has Spectra pump for home. No further questions at present. To call for assist prn  Patient Name: Erin Morris [id number] Date: 11/26/2018 Reason for consult: Follow-up assessment   Maternal Data Formula Feeding for Exclusion: No Has patient been taught Hand Expression?: Yes Does the patient have breastfeeding experience prior to this delivery?: No  Feeding Feeding Type: Breast Fed  LATCH Score                   Interventions    Lactation Tools Discussed/Used     Consult Status Consult Status: PRN    Truddie Crumble 11/26/2018, 1:43 PM

## 2018-11-27 NOTE — Lactation Note (Signed)
This note was copied from a baby's chart. Lactation Consultation Note  Patient Name: Erin Morris [id number] Date: 11/27/2018   Baby 34 hours old.  8.1% weight loss.  3 voids/3 stools in the last 24 hours. Baby cluster fed last night and now mother's R nipple is cracked. She has coconut oil. Discussed if mother is too sore to breastfeed on R side, mother should pump w/ manual or her personal DEBP at home. Give volume pumped back to baby. Discussed chin tug to widen latch. Feed on demand approximately 8-12 times per day.   Reviewed engorgement care and monitoring voids/stools. Mom made aware of O/P services, breastfeeding support groups, community resources, and our phone # for post-discharge questions.       Maternal Data    Feeding Feeding Type: Breast Fed  LATCH Score                   Interventions    Lactation Tools Discussed/Used     Consult Status      Carlye Grippe 11/27/2018, 9:48 AM

## 2018-11-27 NOTE — Discharge Instructions (Signed)
Call MD for T>100.4, heavy vaginal bleeding, severe abdominal pain, or respiratory distress.  Call office to schedule postpartum visit in 6 weeks.  Pelvic rest x 6 weeks.   

## 2018-11-27 NOTE — Discharge Summary (Addendum)
Obstetric Discharge Summary Reason for Admission: induction of labor, history of MI with stent placement (full cardiac clearance) Prenatal Procedures: none Intrapartum Procedures: spontaneous vaginal delivery Postpartum Procedures: none Complications-Operative and Postpartum: vaginal and right periurethral laceration Hemoglobin  Date Value Ref Range Status  11/26/2018 9.8 (L) 12.0 - 15.0 g/dL Final   HGB  Date Value Ref Range Status  05/16/2009 11.5 (L) 11.6 - 15.9 g/dL Final   HCT  Date Value Ref Range Status  11/26/2018 30.1 (L) 36.0 - 46.0 % Final  05/16/2009 34.2 (L) 34.8 - 46.6 % Final    Physical Exam:  General: alert, cooperative and appears stated age 33: appropriate Uterine Fundus: firm Incision: healing well, no significant drainage, no dehiscence DVT Evaluation: No evidence of DVT seen on physical exam. Negative Homan's sign. No cords or calf tenderness.  Discharge Diagnoses: Term Pregnancy-delivered  Discharge Information: Date: 11/27/2018 Activity: pelvic rest Diet: routine Medications: PNV Condition: stable Instructions: refer to practice specific booklet Discharge to: home   Newborn Data: Live born female  Birth Weight: 9 lb 1 oz (4111 g) APGAR: 43, 9  Newborn Delivery   Birth date/time:  11/25/2018 12:01:00 Delivery type:  Vaginal, Spontaneous     Home with mother.  Linda Hedges 11/27/2018, 7:18 AM

## 2019-01-10 DIAGNOSIS — Z1389 Encounter for screening for other disorder: Secondary | ICD-10-CM | POA: Diagnosis not present

## 2019-01-27 DIAGNOSIS — Z3482 Encounter for supervision of other normal pregnancy, second trimester: Secondary | ICD-10-CM | POA: Diagnosis not present

## 2019-01-27 DIAGNOSIS — Z3483 Encounter for supervision of other normal pregnancy, third trimester: Secondary | ICD-10-CM | POA: Diagnosis not present

## 2019-02-02 DIAGNOSIS — Z30430 Encounter for insertion of intrauterine contraceptive device: Secondary | ICD-10-CM | POA: Diagnosis not present

## 2019-02-02 DIAGNOSIS — Z3202 Encounter for pregnancy test, result negative: Secondary | ICD-10-CM | POA: Diagnosis not present

## 2019-02-28 DIAGNOSIS — Z3483 Encounter for supervision of other normal pregnancy, third trimester: Secondary | ICD-10-CM | POA: Diagnosis not present

## 2019-02-28 DIAGNOSIS — Z3482 Encounter for supervision of other normal pregnancy, second trimester: Secondary | ICD-10-CM | POA: Diagnosis not present

## 2019-03-20 ENCOUNTER — Other Ambulatory Visit: Payer: Self-pay

## 2019-03-20 ENCOUNTER — Ambulatory Visit (INDEPENDENT_AMBULATORY_CARE_PROVIDER_SITE_OTHER): Payer: 59 | Admitting: Family Medicine

## 2019-03-20 ENCOUNTER — Encounter: Payer: Self-pay | Admitting: Family Medicine

## 2019-03-20 VITALS — BP 114/70 | Temp 97.8°F | Ht 66.25 in | Wt 196.0 lb

## 2019-03-20 DIAGNOSIS — R5383 Other fatigue: Secondary | ICD-10-CM | POA: Diagnosis not present

## 2019-03-20 DIAGNOSIS — Z0000 Encounter for general adult medical examination without abnormal findings: Secondary | ICD-10-CM | POA: Diagnosis not present

## 2019-03-20 DIAGNOSIS — E782 Mixed hyperlipidemia: Secondary | ICD-10-CM

## 2019-03-20 NOTE — Progress Notes (Signed)
Subjective:    Patient ID: Erin Morris, female    DOB: Aug 11, 1986, 33 y.o.   MRN: [id number]  HPI The patient comes in today for a wellness visit.  No hx of troubles with the simvastatin  Had cut  The lipid meds bck some   Has not had to see the card since saw the baby   Chol 135  Exercise doing wel  Taking reg activities this way   Had some hip issues woth preg, but overall  berter4     Diet overall good, pretty good in fact    Working with an Web designer and now things are beter with weight loss   Sees gy for reg visit s         A review of their health history was completed.  A review of medications was also completed.  Any needed refills; no  Eating habits: health conscious  Falls/  MVA accidents in past few months: none  Regular exercise: weight lifting and cardio  Specialist pt sees on regular basis: Dr. Domenic Polite  Preventative health issues were discussed.   Additional concerns: cholesterol - should she start back on simvastatin. Was stopped while she was pregnant.      Review of Systems  Constitutional: Negative for activity change, appetite change and fatigue.  HENT: Negative for congestion and rhinorrhea.   Eyes: Negative for discharge.  Respiratory: Negative for cough, chest tightness and wheezing.   Cardiovascular: Negative for chest pain.  Gastrointestinal: Negative for abdominal pain, blood in stool and vomiting.  Endocrine: Negative for polyphagia.  Genitourinary: Negative for difficulty urinating and frequency.  Musculoskeletal: Negative for neck pain.  Skin: Negative for color change.  Allergic/Immunologic: Negative for environmental allergies and food allergies.  Neurological: Negative for weakness and headaches.  Psychiatric/Behavioral: Negative for agitation and behavioral problems.  All other systems reviewed and are negative.      Objective:   Physical Exam Constitutional:      Appearance: She is  well-developed.  HENT:     Head: Normocephalic.     Right Ear: External ear normal.     Left Ear: External ear normal.  Eyes:     Pupils: Pupils are equal, round, and reactive to light.  Neck:     Musculoskeletal: Normal range of motion.     Thyroid: No thyromegaly.  Cardiovascular:     Rate and Rhythm: Normal rate and regular rhythm.     Heart sounds: Normal heart sounds. No murmur.  Pulmonary:     Effort: Pulmonary effort is normal. No respiratory distress.     Breath sounds: Normal breath sounds. No wheezing.  Abdominal:     General: Bowel sounds are normal. There is no distension.     Palpations: Abdomen is soft. There is no mass.     Tenderness: There is no abdominal tenderness.  Musculoskeletal: Normal range of motion.        General: No tenderness.  Lymphadenopathy:     Cervical: No cervical adenopathy.  Skin:    General: Skin is warm and dry.  Neurological:     Mental Status: She is alert and oriented to person, place, and time.     Motor: No abnormal muscle tone.  Psychiatric:        Behavior: Behavior normal.           Assessment & Plan:  Impression wellness exam.  Diet discussed.  Exercise discussed.  No difficulties with anxiety or depression.  Has a several  month old child.  Is still breast-feeding.  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  Will hold off on statin for now while breast-feeding.  I think can return the statins once breast-feeding stops  2.  Coronary artery disease clinically stable currently off statins due to breast-feeding status  Diet discussed exercise discussed appropriate screening blood work

## 2019-03-21 DIAGNOSIS — E782 Mixed hyperlipidemia: Secondary | ICD-10-CM | POA: Diagnosis not present

## 2019-03-21 DIAGNOSIS — R5383 Other fatigue: Secondary | ICD-10-CM | POA: Diagnosis not present

## 2019-03-21 DIAGNOSIS — Z0000 Encounter for general adult medical examination without abnormal findings: Secondary | ICD-10-CM | POA: Diagnosis not present

## 2019-03-22 LAB — HEPATIC FUNCTION PANEL
ALT: 32 IU/L (ref 0–32)
AST: 22 IU/L (ref 0–40)
Albumin: 4.3 g/dL (ref 3.8–4.8)
Alkaline Phosphatase: 80 IU/L (ref 39–117)
Bilirubin Total: <0.2 mg/dL (ref 0.0–1.2)
Bilirubin, Direct: 0.07 mg/dL (ref 0.00–0.40)
Total Protein: 6.2 g/dL (ref 6.0–8.5)

## 2019-03-22 LAB — LIPID PANEL
Chol/HDL Ratio: 2.2 ratio (ref 0.0–4.4)
Cholesterol, Total: 120 mg/dL (ref 100–199)
HDL: 55 mg/dL (ref >39)
LDL Calculated: 54 mg/dL (ref 0–99)
Triglycerides: 57 mg/dL (ref 0–149)
VLDL Cholesterol Cal: 11 mg/dL (ref 5–40)

## 2019-03-22 LAB — BASIC METABOLIC PANEL
BUN/Creatinine Ratio: 24 — ABNORMAL HIGH (ref 9–23)
BUN: 15 mg/dL (ref 6–20)
CO2: 24 mmol/L (ref 20–29)
Calcium: 9.2 mg/dL (ref 8.7–10.2)
Chloride: 103 mmol/L (ref 96–106)
Creatinine, Ser: 0.62 mg/dL (ref 0.57–1.00)
GFR calc Af Amer: 137 mL/min/{1.73_m2} (ref >59)
GFR calc non Af Amer: 119 mL/min/{1.73_m2} (ref >59)
Glucose: 90 mg/dL (ref 65–99)
Potassium: 4.9 mmol/L (ref 3.5–5.2)
Sodium: 143 mmol/L (ref 134–144)

## 2019-03-22 LAB — TSH: TSH: 3.76 u[IU]/mL (ref 0.450–4.500)

## 2019-04-03 ENCOUNTER — Encounter: Payer: Self-pay | Admitting: Family Medicine

## 2019-04-18 DIAGNOSIS — Z3482 Encounter for supervision of other normal pregnancy, second trimester: Secondary | ICD-10-CM | POA: Diagnosis not present

## 2019-04-18 DIAGNOSIS — Z3483 Encounter for supervision of other normal pregnancy, third trimester: Secondary | ICD-10-CM | POA: Diagnosis not present

## 2019-04-20 ENCOUNTER — Ambulatory Visit: Payer: 59 | Admitting: Cardiology

## 2019-04-20 ENCOUNTER — Telehealth: Payer: Self-pay | Admitting: Cardiology

## 2019-04-20 MED ORDER — SIMVASTATIN 5 MG PO TABS: 5.0000 mg | ORAL_TABLET | ORAL | 3 refills | Status: DC

## 2019-04-20 MED FILL — SIMVASTATIN 5 MG TABLET: 5 | 90 days supply | Qty: 45 | Fill #0

## 2019-04-20 NOTE — Telephone Encounter (Signed)
Needing pharmacy changed to [facility] pt so it will be sent to [facility]

## 2019-04-20 NOTE — Telephone Encounter (Signed)
Pharmacy changed in chart 

## 2019-04-21 ENCOUNTER — Telehealth: Payer: Self-pay

## 2019-04-21 MED ORDER — SIMVASTATIN 5 MG PO TABS: 5.0000 mg | ORAL_TABLET | ORAL | 3 refills | Status: DC

## 2019-04-21 NOTE — Telephone Encounter (Signed)
done

## 2019-04-21 NOTE — Telephone Encounter (Signed)
-----   Message from Satira Sark, MD sent at 04/20/2019  7:59 AM EDT ----- Can you please send in a refill for Zocor 5 mg every other day?  Thank you.

## 2019-06-20 ENCOUNTER — Ambulatory Visit: Payer: 59 | Admitting: Cardiology

## 2019-06-20 ENCOUNTER — Encounter: Payer: Self-pay | Admitting: Cardiology

## 2019-06-20 ENCOUNTER — Other Ambulatory Visit: Payer: Self-pay

## 2019-06-20 VITALS — BP 113/65 | HR 72 | Temp 96.8°F | Ht 67.0 in | Wt 193.0 lb

## 2019-06-20 DIAGNOSIS — I2510 Atherosclerotic heart disease of native coronary artery without angina pectoris: Secondary | ICD-10-CM

## 2019-06-20 NOTE — Progress Notes (Signed)
Cardiology Office Note  Date: 06/20/2019   ID: Erin Morris, DOB Sep 01, 1986, MRN [id number]  PCP:  Mikey Kirschner, MD  Cardiologist:  [name], MD Electrophysiologist:  None   Chief Complaint  Patient presents with  . Cardiac follow-up    History of Present Illness: Erin Morris is a 33 y.o. female last seen in December 2019.  She presents for a routine visit.  She delivered a baby girl back in early March, has been doing well since that time.  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  She is exercising 4 days a week, combination of weights and also walking outdoors.  She does not report any angina symptoms, NYHA class I dyspnea.  I reviewed her medications which are outlined below.  She is now back on low-dose statin therapy, her last LDL was 54.  I personally reviewed her ECG today which shows normal sinus rhythm with PVC.  Past Medical History:  Diagnosis Date  . Coronary atherosclerosis of native coronary artery    BMS to mid circumflex 2010  . Headache   . HPV in female   . NSTEMI (non-ST elevated myocardial infarction) ([place])    2010 - had prior workup for hypercoagulable state that was negative  . Vaginal Pap smear, abnormal     Past Surgical History:  Procedure Laterality Date  . addnoids    . COLPOSCOPY    . LEFT HEART CATHETERIZATION WITH CORONARY ANGIOGRAM N/A 12/15/2013   Procedure: LEFT HEART CATHETERIZATION WITH CORONARY ANGIOGRAM;  Surgeon: Troy Sine, MD;  Location: [facility] CATH LAB;  Service: Cardiovascular;  Laterality: N/A;  . SHOULDER SURGERY Left   . TONSILLECTOMY    . WISDOM TOOTH EXTRACTION      Current Outpatient Medications  Medication Sig Dispense Refill  . aspirin 81 MG EC tablet Take 81 mg by mouth daily.      . nitroGLYCERIN (NITROSTAT) 0.4 MG SL tablet Place 0.4 mg under the tongue every 5 (five) minutes as needed for chest pain.    . simvastatin (ZOCOR) 5 MG tablet Take 1 tablet (5  mg total) by mouth every other day. 45 tablet 3   No current facility-administered medications for this visit.    Allergies:  Nsaids   Social History: The patient  reports that she has never smoked. She has never used smokeless tobacco. She reports current alcohol use. She reports that she does not use drugs.   ROS:  Please see the history of present illness. Otherwise, complete review of systems is positive for none.  All other systems are reviewed and negative.   Physical Exam: VS:  BP 113/65   Pulse 72   Temp (!) 96.8 F (36 C)   Ht 5\' 7"  (1.702 m)   Wt 193 lb (87.5 kg)   SpO2 97%   BMI 30.23 kg/m , BMI Body mass index is 30.23 kg/m.  Wt Readings from Last 3 Encounters:  06/20/19 193 lb (87.5 kg)  03/20/19 196 lb (88.9 kg)  11/24/18 226 lb (102.5 kg)    General: Patient appears comfortable at rest. HEENT: Conjunctiva and lids normal, wearing a mask. Neck: Supple, no elevated JVP or carotid bruits, no thyromegaly. Lungs: Clear to auscultation, nonlabored breathing at rest. Cardiac: Regular rate and rhythm, no S3 or significant systolic murmur, no pericardial rub. Abdomen: Soft, nontender, bowel sounds present. Extremities: No pitting edema, distal pulses 2+.  ECG:  An ECG dated 03/02/2018 was  personally reviewed today and demonstrated:  Normal sinus rhythm.  Recent Labwork: 11/26/2018: Hemoglobin 9.8; Platelets 162 03/21/2019: ALT 32; AST 22; BUN 15; Creatinine, Ser 0.62; Potassium 4.9; Sodium 143; TSH 3.760     Component Value Date/Time   CHOL 120 03/21/2019 0842   TRIG 57 03/21/2019 0842   HDL 55 03/21/2019 0842   CHOLHDL 2.2 03/21/2019 0842   CHOLHDL 3 03/02/2018 0718   VLDL 11.6 03/02/2018 0718   LDLCALC 54 03/21/2019 0842    Other Studies Reviewed Today:  Cardiac catheterization 12/15/2013: HEMODYNAMICS:  Central Aorta: 86/51  Left Ventricle: 86/2  ANGIOGRAPHY:  The left main coronary artery was angiographically normal and bifurcated into the LAD  and left circumflex vessel.  The left anterior descending artery was angiographically and extended to the LV apex.   The left circumflex was a large caliber vessel. This gave rise to a very high marginal branch. The proximal circumflex had a widely patent stent extended into the second marginal branch. The stent was widely patent without evidence for restenosis or any residual narrowing.  The RCA was angiographically normal it gave rise to the PDA and small posterolateral branch.  Left ventricular function was in the low-normal range. Visualization was suboptimal to truly delineate any wall motion abnormality definitively.  Assessment and Plan:  CAD status post BMS to the circumflex in 2010, patent angiography in 2015.  She continues to do very well, no active angina symptoms with regular exercise plan.  She remains on low-dose aspirin as well as statin therapy and has nitroglycerin available.  ECG reviewed and stable.  No clear indication for follow-up ischemic testing at this time, will continue with observation.  Medication Adjustments/Labs and Tests Ordered: Current medicines are reviewed at length with the patient today.  Concerns regarding medicines are outlined above.   Tests Ordered: Orders Placed This Encounter  Procedures  . EKG 12-Lead    Medication Changes: No orders of the defined types were placed in this encounter.   Disposition:  Follow up 1 year in the [place] office.  Signed, Satira Sark, MD, [facility] 06/20/2019 8:32 AM    [facility] at [facility] [address], [place], [place] [address] Phone: [phone]; Fax: [phone]

## 2019-06-20 NOTE — Patient Instructions (Signed)
Medication Instructions: Your physician recommends that you continue on your current medications as directed. Please refer to the Current Medication list given to you today.   Labwork: None today  Procedures/Testing: None  Follow-Up: 1 year with Dr.McDowell  Any Additional Special Instructions Will Be Listed Below (If Applicable).     If you need a refill on your cardiac medications before your next appointment, please call your pharmacy.      Thank you for choosing [facility] !

## 2019-08-15 MED FILL — SIMVASTATIN 5 MG TABLET: 5 | 90 days supply | Qty: 45 | Fill #0

## 2019-08-16 DIAGNOSIS — R102 Pelvic and perineal pain: Secondary | ICD-10-CM | POA: Diagnosis not present

## 2019-08-16 DIAGNOSIS — Z3202 Encounter for pregnancy test, result negative: Secondary | ICD-10-CM | POA: Diagnosis not present

## 2019-08-31 ENCOUNTER — Other Ambulatory Visit: Payer: Self-pay

## 2019-08-31 DIAGNOSIS — Z20822 Contact with and (suspected) exposure to covid-19: Secondary | ICD-10-CM

## 2019-09-01 LAB — NOVEL CORONAVIRUS, NAA: SARS-CoV-2, NAA: NOT DETECTED

## 2019-09-19 ENCOUNTER — Other Ambulatory Visit: Payer: 59

## 2019-10-09 ENCOUNTER — Other Ambulatory Visit: Payer: Self-pay

## 2019-10-09 ENCOUNTER — Telehealth: Payer: Self-pay

## 2019-10-09 ENCOUNTER — Ambulatory Visit (INDEPENDENT_AMBULATORY_CARE_PROVIDER_SITE_OTHER): Payer: 59 | Admitting: Family Medicine

## 2019-10-09 DIAGNOSIS — R21 Rash and other nonspecific skin eruption: Secondary | ICD-10-CM | POA: Diagnosis not present

## 2019-10-09 MED ORDER — NITROGLYCERIN 0.4 MG SL SUBL: 0.4000 mg | SUBLINGUAL_TABLET | SUBLINGUAL | 3 refills | Status: DC | PRN

## 2019-10-09 MED ORDER — HYDROCORTISONE 2.5 % EX CREA: TOPICAL_CREAM | CUTANEOUS | 0 refills | Status: DC

## 2019-10-09 MED ORDER — PREDNISONE 20 MG PO TABS: ORAL_TABLET | ORAL | 0 refills | Status: DC

## 2019-10-09 MED FILL — predniSONE 20 MG TABS: 20 | 8 days supply | Qty: 17 | Fill #0

## 2019-10-09 MED FILL — HYDROCORTISONE 2.5% CREAM: 2.5 | 12 days supply | Qty: 30 | Fill #0

## 2019-10-09 MED FILL — NITROGLYCERIN 0.4 MG TAB SL: 0.4 | 5 days supply | Qty: 25 | Fill #0

## 2019-10-09 NOTE — Telephone Encounter (Signed)
Refilled  NTG per request 

## 2019-10-09 NOTE — Progress Notes (Signed)
   Subjective:    Patient ID: Erin Morris, female    DOB: 1986/03/26, 34 y.o.   MRN: [id number]  HPI Pt states for a couple of weeks she has had some swelling around and under eyes along with a rash on the inside of legs and on arms. Pt states she did start using a Lysol additive when she washed her scrubs. Pt states she has stopped that. This morning, pt states both her eyes are swollen from cheek up. Pt did take a Benadryl this morning. Pt states she does not see a rash in particular but feels itchy.  Virtual Visit via Video Note  I connected with Erin Morris on 10/09/19 at  9:30 AM EST by a video enabled telemedicine application and verified that I am speaking with the correct person using two identifiers.  Location: Patient: home Provider: office   I discussed the limitations of evaluation and management by telemedicine and the availability of in person appointments. The patient expressed understanding and agreed to proceed.  History of Present Illness:    Observations/Objective:   Assessment and Plan:   Follow Up Instructions:    I discussed the assessment and treatment plan with the patient. The patient was provided an opportunity to ask questions and all were answered. The patient agreed with the plan and demonstrated an understanding of the instructions.   The patient was advised to call back or seek an in-person evaluation if the symptoms worsen or if the condition fails to improve as anticipated.  I provided 20 minutes of non-face-to-face time during this encounter.    Patient has had a rash now for couple weeks.  Started when skin had a hypersensitivity reaction apparently to Lysol additives to clothing.  Since then has had ongoing rash.  Last night had irritated itchy eye.  Woke up this morning both eyes itchy and quite swollen.  No difficulty with vision.  No fever no chills  No swelling of anklesSee above    Objective:   Physical  Exam   Virtual     Assessment & Plan:  Impression hypersensitivity/allergenic rash with now periocular extension.  Prednisone taper.  Hydrocortisone cream twice daily to affected area.  Benadryl as needed.  Avoid detergent additives in future

## 2019-10-23 ENCOUNTER — Encounter: Payer: Self-pay | Admitting: Family Medicine

## 2019-11-19 ENCOUNTER — Ambulatory Visit
Admission: EM | Admit: 2019-11-19 | Discharge: 2019-11-19 | Disposition: A | Discharge status: Home / Self Care | Payer: 59 | Attending: Emergency Medicine | Admitting: Emergency Medicine

## 2019-11-19 ENCOUNTER — Encounter: Payer: Self-pay | Admitting: Family Medicine

## 2019-11-19 ENCOUNTER — Other Ambulatory Visit: Payer: Self-pay

## 2019-11-19 DIAGNOSIS — L239 Allergic contact dermatitis, unspecified cause: Secondary | ICD-10-CM | POA: Diagnosis not present

## 2019-11-19 DIAGNOSIS — R220 Localized swelling, mass and lump, head: Secondary | ICD-10-CM

## 2019-11-19 MED ORDER — METHYLPREDNISOLONE SODIUM SUCC 125 MG IJ SOLR
60.0000 mg | Freq: Once | INTRAMUSCULAR | Status: AC
  Administered 2019-11-19: 60 mg via INTRAMUSCULAR

## 2019-11-19 MED ORDER — PREDNISONE 10 MG (21) PO TBPK: ORAL_TABLET | ORAL | 0 refills | Status: DC

## 2019-11-19 MED ORDER — OLOPATADINE HCL 0.1 % OP SOLN: 1.0000 [drp] | Freq: Two times a day (BID) | OPHTHALMIC | 12 refills | Status: DC

## 2019-11-19 NOTE — ED Triage Notes (Signed)
Face swelling- left eye since last night. Patient states that this has happened in the past, some allergic reaction, unsure of the cause. Pt has taken benadryl and used hydrocortisone cream to help w/ the swelling.

## 2019-11-19 NOTE — Discharge Instructions (Addendum)
Take prednisone taper as prescribed Pataday ophthalmic was prescribed for itching eye Solu-Medrol 60 mg IM was given in office Continue to take Benadryl Continue to use hydrocortisone cream Follow-up with primary care Return for worsening symptoms

## 2019-11-19 NOTE — ED Provider Notes (Signed)
[facility]    CSN: [id number] Arrival date & time: 11/19/19  1157      History   Chief Complaint Chief Complaint  Patient presents with  . Rash/facial swelling    HPI Erin Morris is a 34 y.o. female.   Who presented to the urgent care with a  complaint of swollen face, puffy eyes, and rash for the past 1 day.  She denies changes in soaps, detergents, or anyone with similar symptoms.  She localizes the rash to her face, chest and arm.  She describes it as  itchy and red.  Reports similar symptoms in the past.  Was prescribed prednisone taper with symptoms relief.  Currently taking Benadryl and topical hydrocortisone with mild relief.  Nothing made her symptoms worse.  Denies chills, fever, nausea, vomiting, diarrhea, chest pain, chest tightness.     Past Medical History:  Diagnosis Date  . Coronary atherosclerosis of native coronary artery    BMS to mid circumflex 2010  . Headache   . HPV in female   . NSTEMI (non-ST elevated myocardial infarction) ([place])    2010 - had prior workup for hypercoagulable state that was negative  . Vaginal Pap smear, abnormal     Patient Active Problem List   Diagnosis Date Noted  . Pregnancy 11/25/2018  . Encounter for elective induction of labor 11/24/2018  . Mixed hyperlipidemia 05/14/2009  . Coronary artery disease involving native coronary artery of native heart without angina pectoris 04/16/2009    Past Surgical History:  Procedure Laterality Date  . addnoids    . COLPOSCOPY    . LEFT HEART CATHETERIZATION WITH CORONARY ANGIOGRAM N/A 12/15/2013   Procedure: LEFT HEART CATHETERIZATION WITH CORONARY ANGIOGRAM;  Surgeon: Troy Sine, MD;  Location: [facility] CATH LAB;  Service: Cardiovascular;  Laterality: N/A;  . SHOULDER SURGERY Left   . TONSILLECTOMY    . WISDOM TOOTH EXTRACTION      OB History    Gravida  1   Para  1   Term  1   Preterm  0   AB  0   Living  1     SAB  0   TAB  0   Ectopic  0   Multiple  0   Live Births  1            Home Medications    Prior to Admission medications   Medication Sig Start Date End Date Taking? Authorizing Provider  aspirin 81 MG EC tablet Take 81 mg by mouth daily.      [provider]  hydrocortisone 2.5 % cream Apply BID to rash 10/09/19   Mikey Kirschner, MD  nitroGLYCERIN (NITROSTAT) 0.4 MG SL tablet Place 1 tablet (0.4 mg total) under the tongue every 5 (five) minutes as needed for chest pain. 10/09/19   Satira Sark, MD  olopatadine (PATADAY) 0.1 % ophthalmic solution Place 1 drop into both eyes 2 (two) times daily. 11/19/19   Bueford Arp, Darrelyn Hillock, FNP  predniSONE (STERAPRED UNI-PAK 21 TAB) 10 MG (21) TBPK tablet Take 6 tabs by mouth daily  for 2 days, then 5 tabs for 2 days, then 4 tabs for 2 days, then 3 tabs for 2 days, 2 tabs for 2 days, then 1 tab by mouth daily for 2 days 11/19/19   Emerson Monte, FNP  simvastatin (ZOCOR) 5 MG tablet Take 1 tablet (5 mg total) by mouth every other day. 04/21/19   Satira Sark,  MD    Family History Family History  Problem Relation Age of Onset  . Thyroid disease Mother   . Heart disease Paternal Grandfather   . Colon cancer Paternal Grandmother     Social History Social History   Tobacco Use  . Smoking status: Never Smoker  . Smokeless tobacco: Never Used  Substance Use Topics  . Alcohol use: Yes    Alcohol/week: 0.0 standard drinks    Comment: maybe once a month  . Drug use: No     Allergies   Nsaids   Review of Systems Review of Systems  Constitutional: Negative.   Eyes: Negative for discharge, redness and itching.  Respiratory: Negative.   Cardiovascular: Negative.   Skin: Positive for rash.     Physical Exam Triage Vital Signs ED Triage Vitals  Enc Vitals Group     BP 11/19/19 1204 106/70     Pulse Rate 11/19/19 1204 71     Resp 11/19/19 1204 16     Temp 11/19/19 1204 98.1 F (36.7 C)     Temp Source 11/19/19 1204 Oral     SpO2 11/19/19  1204 98 %     Weight --      Height --      Head Circumference --      Peak Flow --      Pain Score 11/19/19 1210 0     Pain Loc --      Pain Edu? --      Excl. in [place]? --    No data found.  Updated Vital Signs BP 106/70 (BP Location: Right Arm)   Pulse 71   Temp 98.1 F (36.7 C) (Oral)   Resp 16   SpO2 98%   Visual Acuity Right Eye Distance:   Left Eye Distance:   Bilateral Distance:    Right Eye Near:   Left Eye Near:    Bilateral Near:     Physical Exam Vitals and nursing note reviewed.  Constitutional:      General: She is not in acute distress.    Appearance: Normal appearance. She is normal weight. She is not ill-appearing or toxic-appearing.  Cardiovascular:     Rate and Rhythm: Normal rate and regular rhythm.     Pulses: Normal pulses.     Heart sounds: Normal heart sounds. No murmur. No gallop.   Pulmonary:     Effort: Pulmonary effort is normal. No respiratory distress.     Breath sounds: Normal breath sounds. No stridor. No wheezing, rhonchi or rales.  Chest:     Chest wall: No tenderness.  Skin:    General: Skin is warm.     Coloration: Skin is not pale.     Findings: Rash present. No bruising, erythema or lesion. Rash is macular. Rash is not crusting.  Neurological:     Mental Status: She is alert.      UC Treatments / Results  Labs (all labs ordered are listed, but only abnormal results are displayed) Labs Reviewed - No data to display  EKG   Radiology No results found.  Procedures Procedures (including critical care time)  Medications Ordered in UC Medications  methylPREDNISolone sodium succinate (SOLU-MEDROL) 125 mg/2 mL injection 60 mg (has no administration in time range)    Initial Impression / Assessment and Plan / UC Course  I have reviewed the triage vital signs and the nursing notes.  Pertinent labs & imaging results that were available during my care of the patient were  reviewed by me and considered in my medical  decision making (see chart for details).   Patient is stable at discharge. Advised patient to take prednisone taper as prescribed Solu-Medrol 60 mg IM was given in office Continue to take Benadryl Continue to use hydrocortisone cream Follow-up with primary care Return for worsening of symptoms   Final Clinical Impressions(s) / UC Diagnoses   Final diagnoses:  Allergic dermatitis     Discharge Instructions     Take prednisone taper as prescribed Pataday ophthalmic was prescribed for itching eye Solu-Medrol 60 mg IM was given in office Continue to take Benadryl Continue to use hydrocortisone cream Follow-up with primary care Return for worsening symptoms    ED Prescriptions    Medication Sig Dispense Auth. Provider   predniSONE (STERAPRED UNI-PAK 21 TAB) 10 MG (21) TBPK tablet Take 6 tabs by mouth daily  for 2 days, then 5 tabs for 2 days, then 4 tabs for 2 days, then 3 tabs for 2 days, 2 tabs for 2 days, then 1 tab by mouth daily for 2 days 42 tablet Goldy Calandra S, FNP   olopatadine (PATADAY) 0.1 % ophthalmic solution Place 1 drop into both eyes 2 (two) times daily. 5 mL Amario Longmore, Darrelyn Hillock, FNP     PDMP not reviewed this encounter.   Emerson Monte, [place] 11/19/19 1246

## 2019-11-20 MED FILL — predniSONE 10 MG TABS: 10 | 12 days supply | Qty: 42 | Fill #0

## 2019-11-20 NOTE — Telephone Encounter (Signed)
Erin Kirschner, MD     Yes, allergy referrral

## 2019-11-27 MED FILL — SIMVASTATIN 5 MG TABLET: 5 | 90 days supply | Qty: 45 | Fill #1

## 2019-12-29 ENCOUNTER — Other Ambulatory Visit: Payer: Self-pay

## 2019-12-29 ENCOUNTER — Ambulatory Visit: Payer: 59 | Admitting: Allergy & Immunology

## 2019-12-29 ENCOUNTER — Encounter: Payer: Self-pay | Admitting: Allergy & Immunology

## 2019-12-29 VITALS — BP 106/74 | HR 66 | Temp 98.3°F | Resp 18 | Ht 66.5 in | Wt 201.0 lb

## 2019-12-29 DIAGNOSIS — H10403 Unspecified chronic conjunctivitis, bilateral: Secondary | ICD-10-CM

## 2019-12-29 DIAGNOSIS — R21 Rash and other nonspecific skin eruption: Secondary | ICD-10-CM

## 2019-12-29 MED ORDER — OLOPATADINE HCL 0.2 % OP SOLN: 1.0000 [drp] | Freq: Two times a day (BID) | OPHTHALMIC | 1 refills | Status: DC

## 2019-12-29 NOTE — Progress Notes (Signed)
NEW PATIENT  Date of Service/Encounter:  12/29/19  Referring provider: Mikey Kirschner, MD   Assessment:   Rash   Chronic conjunctivitis of both eyes    I am unsure why she experienced two episodes of hives and swelling.  They definitely seem to be environmental and not related to food at all.  Unfortunately, testing today was unremarkable.  We are going to have her in for patch testing, which I hope will be more enlightening.  I did do a full urticarial work-up since these episodes seem to have stopped in the last couple of months.  However, we could certainly consider blood work in the future if indicated.  If it happens again, I did ask her to come back and see [place] immediately.  We are here 3 days/week and could easily fit her in.  Regarding her conjunctivitis, we will have to keep this on the back burner as possibly being related.  This is especially true if any autoimmune signs popp up in her work-up.  There are various autoimmune syndromes that can present with conjunctivitis and rash. We may obtain labs in the future.  Plan/Recommendations:   1. Rash - Testing to the entire environmental allergy panel was negative. - Testing to the most common foods was negative as well, ruling out 95 to 97% of all food allergies. - I think the next best step would be to do patch testing which looks for contact dermatitis to chemicals and metals etc. - You can also bring in urine cosmetics and we can do patch testing to those.  2. Chronic conjunctivitis of both eyes - Start Pataday 1 drop per eye twice daily. - Let us know when you follow up if that helps.  3. Return in about 2 weeks (around 01/12/2020) for [facility] TESTING. This can be an in-person follow up visit.   Subjective:   Erin Morris is a 34 y.o. female presenting today for evaluation of  Chief Complaint  Patient presents with  . Allergy Testing    facial swelling, fine rash inside arms and legs    Erin Morris  has a history of the following: Patient Active Problem List   Diagnosis Date Noted  . Pregnancy 11/25/2018  . Encounter for elective induction of labor 11/24/2018  . Mixed hyperlipidemia 05/14/2009  . Coronary artery disease involving native coronary artery of native heart without angina pectoris 04/16/2009    History obtained from: chart review and patient.  Erin Morris was referred by Mikey Kirschner, MD.     Erin Morris is a 34 y.o. female presenting for an evaluation of a rash.  Her first reaction was in January and she had another one in February. She started having a fine rash on her legs and arms. It was very itchy. Her mother had gotten a Lysol additive for her laundry to clean up her scrubs. Removing this did not seem to help at all. She had two prednisone tapers in January and February. She was prescribed prednisone by Dr. Wolfgang Phoenix and then another one at Urgent Care. These were five weeks apart. She had one at the end of January and then another ar the began in February. This was over a weekend so they were in in work.   There were some detergent changes at her workplace and there was a change to her mask cleaning. She was not changing detergents aside from the additive at home.  Her mask are no longer cleaned at work, instead  they switch them out for new ones.  Therefore, this is no longer an issue.  She does not use the scrubs at work typically, but when she does she will put the scrubs over her close that she brought in from her home.  Therefore, the scrubs are never in contact directly with her skin.  She does report some red eyes that are there constantly. They are sometimes itchy and sometimes dry.  She has tried some over-the-counter eyedrops without improvement.  She had a baby early March 2020. This was her first baby.   Otherwise, there is no history of other atopic diseases, including asthma, food allergies, drug allergies, stinging insect allergies, urticaria or contact  dermatitis. There is no significant infectious history. Vaccinations are up to date.    Past Medical History: Patient Active Problem List   Diagnosis Date Noted  . Pregnancy 11/25/2018  . Encounter for elective induction of labor 11/24/2018  . Mixed hyperlipidemia 05/14/2009  . Coronary artery disease involving native coronary artery of native heart without angina pectoris 04/16/2009    Medication List:  Allergies as of 12/29/2019      Reactions   Nsaids Other (See Comments)   stent      Medication List       Accurate as of December 29, 2019  4:27 PM. If you have any questions, ask your nurse or doctor.        aspirin 81 MG EC tablet Take 81 mg by mouth daily.   hydrocortisone 2.5 % cream Apply BID to rash   loratadine 10 MG tablet Commonly known as: CLARITIN Take 10 mg by mouth daily.   nitroGLYCERIN 0.4 MG SL tablet Commonly known as: NITROSTAT Place 1 tablet (0.4 mg total) under the tongue every 5 (five) minutes as needed for chest pain.   olopatadine 0.1 % ophthalmic solution Commonly known as: Pataday Place 1 drop into both eyes 2 (two) times daily. What changed: Another medication with the same name was added. Make sure you understand how and when to take each. Changed by: Valentina Shaggy, MD   Olopatadine HCl 0.2 % Soln Commonly known as: Pataday Place 1 drop into both eyes 2 (two) times daily. What changed: You were already taking a medication with the same name, and this prescription was added. Make sure you understand how and when to take each. Changed by: Valentina Shaggy, MD   paragard intrauterine copper Iud IUD ParaGard T 380A 380 square mm intrauterine device  Take 1 device by intrauterine route.   predniSONE 10 MG (21) Tbpk tablet Commonly known as: STERAPRED UNI-PAK 21 TAB Take 6 tabs by mouth daily  for 2 days, then 5 tabs for 2 days, then 4 tabs for 2 days, then 3 tabs for 2 days, 2 tabs for 2 days, then 1 tab by mouth daily for 2 days    simvastatin 5 MG tablet Commonly known as: Zocor Take 1 tablet (5 mg total) by mouth every other day.       Birth History: non-contributory  Developmental History: non-contributory  Past Surgical History: Past Surgical History:  Procedure Laterality Date  . addnoids    . ADENOIDECTOMY    . COLPOSCOPY    . LEFT HEART CATHETERIZATION WITH CORONARY ANGIOGRAM N/A 12/15/2013   Procedure: LEFT HEART CATHETERIZATION WITH CORONARY ANGIOGRAM;  Surgeon: Troy Sine, MD;  Location: [facility] CATH LAB;  Service: Cardiovascular;  Laterality: N/A;  . SHOULDER SURGERY Left   . TONSILLECTOMY    .  WISDOM TOOTH EXTRACTION       Family History: Family History  Problem Relation Age of Onset  . Thyroid disease Mother   . Heart disease Paternal Grandfather   . Colon cancer Paternal Grandmother      Social History: Jaretta lives at home with her family.  She lives in a house that was built in 2007.  There is hardwood throughout the home.  She has electric heating and central cooling.  There is a dog inside of the home.  There are cats and a potbelly pig outside of the home.  There are no dust mite covers on the bedding.  There is no tobacco exposure.  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  She does not have a HEPA filter in her home.  She does not live near an interstate or industrial area. Her pigs are named Lanae Boast and Thayer Jew.    Review of Systems  Constitutional: Negative.  Negative for chills, fever, malaise/fatigue and weight loss.  HENT: Negative.  Negative for congestion, ear discharge, ear pain and sore throat.   Eyes: Negative for pain, discharge and redness.  Respiratory: Negative for cough, sputum production, shortness of breath and wheezing.   Cardiovascular: Negative.  Negative for chest pain and palpitations.  Gastrointestinal: Negative for abdominal pain, constipation, diarrhea, heartburn, nausea and vomiting.  Skin: Positive  for itching and rash.  Neurological: Negative for dizziness and headaches.  Endo/Heme/Allergies: Negative for environmental allergies. Does not bruise/bleed easily.       Objective:   Blood pressure 106/74, pulse 66, temperature 98.3 F (36.8 C), temperature source Temporal, resp. rate 18, height 5' 6.5" (1.689 m), weight 201 lb (91.2 kg), SpO2 96 %, unknown if currently breastfeeding. Body mass index is 31.96 kg/m.   Physical Exam:   Physical Exam  Constitutional: She appears well-developed.  HENT:  Head: Normocephalic and atraumatic.  Right Ear: Tympanic membrane, external ear and ear canal normal. No drainage, swelling or tenderness. Tympanic membrane is not injected, not scarred, not erythematous, not retracted and not bulging.  Left Ear: Tympanic membrane, external ear and ear canal normal. No drainage, swelling or tenderness. Tympanic membrane is not injected, not scarred, not erythematous, not retracted and not bulging.  Nose: Mucosal edema and rhinorrhea present. No nasal deformity or septal deviation. No epistaxis. Right sinus exhibits no maxillary sinus tenderness and no frontal sinus tenderness. Left sinus exhibits no maxillary sinus tenderness and no frontal sinus tenderness.  Mouth/Throat: Uvula is midline and oropharynx is clear and moist. Mucous membranes are not pale and not dry.  Eyes: Pupils are equal, round, and reactive to light. Conjunctivae and EOM are normal. Right eye exhibits no chemosis and no discharge. Left eye exhibits no chemosis and no discharge. Right conjunctiva is not injected. Left conjunctiva is not injected.  Cardiovascular: Normal rate, regular rhythm and normal heart sounds.  Respiratory: Effort normal and breath sounds normal. No accessory muscle usage. No tachypnea. No respiratory distress. She has no wheezes. She has no rhonchi. She has no rales. She exhibits no tenderness.  Moving air well in all lung fields.  No increased work of breathing.   GI: There is no abdominal tenderness. There is no rebound and no guarding.  Lymphadenopathy:       Head (right side): No submandibular, no tonsillar and no occipital adenopathy present.       Head (left side): No submandibular, no tonsillar and no occipital adenopathy present.    She  has no cervical adenopathy.  Neurological: She is alert.  Skin: No abrasion, no petechiae and no rash noted. Rash is not papular, not vesicular and not urticarial. No erythema. No pallor.  Fair skinned.  No urticaria or eczema noted.  Psychiatric: She has a normal mood and affect.     Diagnostic studies:     Allergy Studies:   Airborne Adult Perc - 01/16/20 1427    Time Antigen Placed  1427    Allergen Manufacturer  Lavella Hammock    Location  Back    Number of Test  59    1. Control-Buffer 50% Glycerol  Negative    2. Control-Histamine 1 mg/ml  Negative    3. Albumin saline  Negative    4. [place]  Negative    5. [place]  Negative    6. [place]  Negative    7. [place] Blue  Negative    8. Meadow Fescue  Negative    9. Perennial Rye  Negative    10. Sweet Vernal  Negative    11. Timothy  Negative    12. Cocklebur  Negative    13. Burweed Marshelder  Negative    14. Ragweed, short  Negative    15. Ragweed, Giant  Negative    16. Plantain,  English  Negative    17. Lamb's Quarters  Negative    18. Sheep Sorrell  Negative    19. Rough Pigweed  Negative    20. Marsh Elder, Rough  Negative    21. Mugwort, Common  Negative    22. Ash mix  Negative    23. Birch mix  Negative    24. Beech American  Negative    25. Box, Elder  Negative    26. Cedar, red  Negative    27. Cottonwood, [place]  Negative    28. Elm mix  Negative    29. Hickory mix  Negative    30. Maple mix  Negative    31. Oak, [place] mix  Negative    32. Pecan Pollen  Negative    33. Pine mix  Negative    34. Sycamore Eastern  Negative    35. [place], Black Pollen  Negative    36. Alternaria alternata  Negative    37. Cladosporium  Herbarum  Negative    38. Aspergillus mix  Negative    39. Penicillium mix  Negative    40. Bipolaris sorokiniana (Helminthosporium)  Negative    41. Drechslera spicifera (Curvularia)  Negative    42. Mucor plumbeus  Negative    43. Fusarium moniliforme  Negative    44. Aureobasidium pullulans (pullulara)  Negative    45. Rhizopus oryzae  Negative    46. Botrytis cinera  Negative    47. Epicoccum nigrum  Negative    48. Phoma betae  Negative    49. Candida Albicans  Negative    50. Trichophyton mentagrophytes  Negative    51. Mite, D Farinae  5,000 AU/ml  Negative    52. Mite, D Pteronyssinus  5,000 AU/ml  Negative    53. Cat Hair 10,000 BAU/ml  Negative    54.  Dog Epithelia  Negative    55. Mixed Feathers  Negative    56. Horse Epithelia  Negative    57. Cockroach, German  Negative    58. Mouse  Negative    59. Tobacco Leaf  Negative     Food Adult Perc - 2020-01-16 1400    Time  Antigen Placed  1428    Allergen Manufacturer  [place]    Location  Back    Number of allergen test  10    Control-Histamine 1 mg/ml  2+    1. Peanut  Negative    2. Soybean  Negative    3. Wheat  Negative    4. Sesame  Negative    5. Milk, cow  Negative    6. Egg White, Chicken  Negative    7. Casein  Negative    8. Shellfish Mix  Negative    9. Fish Mix  Negative    10. Cashew  Negative       Allergy testing results were read and interpreted by myself, documented by clinical staff.         Salvatore Marvel, MD [facility] [place]

## 2019-12-29 NOTE — Patient Instructions (Addendum)
1. Rash - Testing to the entire environmental allergy panel was negative. - Testing to the most common foods was negative as well, ruling out 95 to 97% of all food allergies. - I think the next best step would be to do patch testing which looks for contact dermatitis to chemicals and metals etc. - You can also bring in urine cosmetics and we can do patch testing to those.  2. Chronic conjunctivitis of both eyes - Start Pataday 1 drop per eye twice daily. - Let us know when you follow up if that helps.  3. Return in about 2 weeks (around 01/12/2020) for [facility] TESTING. This can be an in-person follow up visit.   Please inform us of any Emergency Department visits, hospitalizations, or changes in symptoms. Call us before going to the ED for breathing or allergy symptoms since we might be able to fit you in for a sick visit. Feel free to contact us anytime with any questions, problems, or concerns.  It was a pleasure to meet you today!  Websites that have reliable patient information: 1. American Academy of Asthma, Allergy, and Immunology: www.aaaai.org 2. Food Allergy Research and Education (FARE): foodallergy.org 3. Mothers of Asthmatics: http://www.asthmacommunitynetwork.org 4. American College of Allergy, Asthma, and Immunology: www.acaai.org   COVID-19 Vaccine Information can be found at: ShippingScam.co.uk For questions related to vaccine distribution or appointments, please email vaccine@[email or web address] [email or web address] or call [phone].     "Like" [place] on Facebook and Instagram for our latest updates!       HAPPY SPRING!  Make sure you are registered to vote! If you have moved or changed any of your contact information, you will need to get this updated before voting!  In some cases, you MAY be able to register to vote online: [email or web address]     True Test looks for the following sensitivities:

## 2020-01-15 ENCOUNTER — Ambulatory Visit: Payer: 59 | Admitting: Family Medicine

## 2020-01-15 NOTE — Progress Notes (Deleted)
Follow-up Note  RE: PAYSLEIGH FRYMIER MRN: [id number] DOB: 03-26-1986 Date of Office Visit: 01/15/2020  Primary care provider: Mikey Kirschner, MD Referring provider: Mikey Kirschner, MD   Giselle returns to the office today for the patch test placement, given suspected history of contact dermatitis.    Diagnostics: True Test patches placed.    Plan:   Allergic contact dermatitis - Instructions provided on care of the patches for the next 48 hours. Mel Almond was instructed to avoid showering for the next 48 hours. Gracious Liebenow will follow up in 48 hours and 96 hours for patch readings.   Call the clinic if this treatment plan is not working well for you  Follow up in 2 days or sooner if needed.

## 2020-01-17 ENCOUNTER — Encounter: Payer: 59 | Admitting: Allergy & Immunology

## 2020-01-19 ENCOUNTER — Encounter: Payer: 59 | Admitting: Allergy & Immunology

## 2020-02-07 DIAGNOSIS — Z01419 Encounter for gynecological examination (general) (routine) without abnormal findings: Secondary | ICD-10-CM | POA: Diagnosis not present

## 2020-02-07 DIAGNOSIS — Z6832 Body mass index (BMI) 32.0-32.9, adult: Secondary | ICD-10-CM | POA: Diagnosis not present

## 2020-03-27 ENCOUNTER — Other Ambulatory Visit: Payer: Self-pay

## 2020-03-27 ENCOUNTER — Ambulatory Visit
Admission: EM | Admit: 2020-03-27 | Discharge: 2020-03-27 | Disposition: A | Discharge status: Home / Self Care | Payer: 59 | Attending: Emergency Medicine | Admitting: Emergency Medicine

## 2020-03-27 ENCOUNTER — Encounter: Payer: Self-pay | Admitting: Emergency Medicine

## 2020-03-27 DIAGNOSIS — Z20818 Contact with and (suspected) exposure to other bacterial communicable diseases: Secondary | ICD-10-CM | POA: Diagnosis not present

## 2020-03-27 LAB — POCT RAPID STREP A (OFFICE): Rapid Strep A Screen: NEGATIVE

## 2020-03-27 NOTE — ED Provider Notes (Addendum)
[place]   [id number] 03/27/20 Arrival Time: 0998  Chief Complaint  Patient presents with  . Sore Throat     SUBJECTIVE: History from: patient.  Erin Morris is a 34 y.o. female who presents to the urgent care with a complaint of strep exposure after her parents tested positive for strep.  Denies alleviating or aggravating factor.  Denies previous symptoms in the past.   Denies fever, chills, fatigue, ear pain, sinus pain, rhinorrhea, nasal congestion, cough, SOB, wheezing, chest pain, nausea, rash, changes in bowel or bladder habits.      ROS: As per HPI.  All other pertinent ROS negative.     Past Medical History:  Diagnosis Date  . Angio-edema   . Coronary atherosclerosis of native coronary artery    BMS to mid circumflex 2010  . Headache   . HPV in female   . NSTEMI (non-ST elevated myocardial infarction) ([place])    2010 - had prior workup for hypercoagulable state that was negative  . Urticaria   . Vaginal Pap smear, abnormal    Past Surgical History:  Procedure Laterality Date  . addnoids    . ADENOIDECTOMY    . COLPOSCOPY    . LEFT HEART CATHETERIZATION WITH CORONARY ANGIOGRAM N/A 12/15/2013   Procedure: LEFT HEART CATHETERIZATION WITH CORONARY ANGIOGRAM;  Surgeon: Troy Sine, MD;  Location[facility] CATH LAB;  Service: Cardiovascular;  Laterality: N/A;  . SHOULDER SURGERY Left   . TONSILLECTOMY    . WISDOM TOOTH EXTRACTION     Allergies  Allergen Reactions  . Nsaids Other (See Comments)    stent   No current facility-administered medications on file prior to encounter.   Current Outpatient Medications on File Prior to Encounter  Medication Sig Dispense Refill  . aspirin 81 MG EC tablet Take 81 mg by mouth daily.      . hydrocortisone 2.5 % cream Apply BID to rash (Patient not taking: Reported on 12/29/2019) 30 g 0  . loratadine (CLARITIN) 10 MG tablet Take 10 mg by mouth daily.    . nitroGLYCERIN (NITROSTAT) 0.4 MG SL tablet Place 1 tablet  (0.4 mg total) under the tongue every 5 (five) minutes as needed for chest pain. 25 tablet 3  . olopatadine (PATADAY) 0.1 % ophthalmic solution Place 1 drop into both eyes 2 (two) times daily. (Patient not taking: Reported on 12/29/2019) 5 mL 12  . Olopatadine HCl (PATADAY) 0.2 % SOLN Place 1 drop into both eyes 2 (two) times daily. 2.5 mL 1  . paragard intrauterine copper IUD IUD ParaGard T 380A 380 square mm intrauterine device  Take 1 device by intrauterine route.    . predniSONE (STERAPRED UNI-PAK 21 TAB) 10 MG (21) TBPK tablet Take 6 tabs by mouth daily  for 2 days, then 5 tabs for 2 days, then 4 tabs for 2 days, then 3 tabs for 2 days, 2 tabs for 2 days, then 1 tab by mouth daily for 2 days (Patient not taking: Reported on 12/29/2019) 42 tablet 0  . simvastatin (ZOCOR) 5 MG tablet Take 1 tablet (5 mg total) by mouth every other day. 45 tablet 3   Social History   Socioeconomic History  . Marital status: Married    Spouse name: Not on file  . Number of children: Not on file  . Years of education: Not on file  . Highest education level: Not on file  Occupational History  . Occupation: radiology tech  Tobacco Use  . Smoking  status: Never Smoker  . Smokeless tobacco: Never Used  Vaping Use  . Vaping Use: Never used  Substance and Sexual Activity  . Alcohol use: Yes    Alcohol/week: 0.0 standard drinks    Comment: maybe once a month  . Drug use: No  . Sexual activity: Yes    Birth control/protection: I.U.D.  Other Topics Concern  . Not on file  Social History Narrative   Single   Lives with her boyfriend   No pregnancy   Social Determinants of Health   Financial Resource Strain:   . Difficulty of Paying Living Expenses:   Food Insecurity:   . Worried About Charity fundraiser in the Last Year:   . Arboriculturist in the Last Year:   Transportation Needs:   . Film/video editor (Medical):   Marland Kitchen Lack of Transportation (Non-Medical):   Physical Activity:   . Days of  Exercise per Week:   . Minutes of Exercise per Session:   Stress:   . Feeling of Stress :   Social Connections:   . Frequency of Communication with Friends and Family:   . Frequency of Social Gatherings with Friends and Family:   . Attends Religious Services:   . Active Member of Clubs or Organizations:   . Attends Archivist Meetings:   Marland Kitchen Marital Status:   Intimate Partner Violence:   . Fear of Current or Ex-Partner:   . Emotionally Abused:   Marland Kitchen Physically Abused:   . Sexually Abused:    Family History  Problem Relation Age of Onset  . Thyroid disease Mother   . Heart disease Paternal Grandfather   . Colon cancer Paternal Grandmother     OBJECTIVE:  Vitals:   03/27/20 1756  BP: 116/76  Pulse: 68  Resp: 16  Temp: 98.3 F (36.8 C)  TempSrc: Oral  SpO2: 97%     General appearance: alert; appears fatigued, but nontoxic, speaking in full sentences and managing own secretions HEENT: NCAT; Ears: EACs clear, TMs pearly gray with visible cone of light, without erythema; Eyes: PERRL, EOMI grossly; Nose: no obvious rhinorrhea; Throat: oropharynx clear, tonsils 1+ and npn erythematous without white tonsillar exudates, uvula midline Neck: supple without LAD Lungs: CTA bilaterally without adventitious breath sounds; cough absent Heart: regular rate and rhythm.  Radial pulses 2+ symmetrical bilaterally Skin: warm and dry Psychological: alert and cooperative; normal mood and affect  LABS: Results for orders placed or performed during the hospital encounter of 03/27/20 (from the past 24 hour(s))  POCT rapid strep A     Status: None   Collection Time: 03/27/20  6:03 PM  Result Value Ref Range   Rapid Strep A Screen Negative Negative     ASSESSMENT & PLAN:  1. Strep throat exposure     No orders of the defined types were placed in this encounter.  Discharge instructions  Strep test negative, will send out for culture and we will call you with results Get plenty  of rest and push fluids May use  lozenges, or popsicles to help alleviate symptoms Take OTC ibuprofen or tylenol as needed for pain Follow up with PCP if symptoms persists Return or go to ER if patient has any new or worsening symptoms such as fever, chills, nausea, vomiting, worsening sore throat, cough, abdominal pain, chest pain, changes in bowel or bladder habits, etc...  Reviewed expectations re: course of current medical issues. Questions answered. Outlined signs and symptoms indicating need for more  acute intervention. Patient verbalized understanding. After Visit Summary given.      Note: This document was prepared using Dragon voice recognition software and may include unintentional dictation errors.    Emerson Monte, FNP 03/27/20 1820    Emerson Monte, FNP 03/27/20 Vernelle Emerald

## 2020-03-27 NOTE — Discharge Instructions (Addendum)
Strep test negative, will send out for culture and we will call you with results Get plenty of rest and push fluids May use  lozenges, or popsicles to help alleviate symptoms Take OTC ibuprofen or tylenol as needed for pain Follow up with PCP if symptoms persists Return or go to ER if patient has any new or worsening symptoms such as fever, chills, nausea, vomiting, worsening sore throat, cough, abdominal pain, chest pain, changes in bowel or bladder habits, etc..Marland Kitchen

## 2020-03-27 NOTE — ED Triage Notes (Signed)
Exposed to strep.  Both parents tested positive today.

## 2020-03-30 LAB — CULTURE, GROUP A STREP (THRC)

## 2020-04-09 MED FILL — SIMVASTATIN 5 MG TABLET: 5 | 90 days supply | Qty: 45 | Fill #2

## 2020-04-24 ENCOUNTER — Ambulatory Visit (INDEPENDENT_AMBULATORY_CARE_PROVIDER_SITE_OTHER): Payer: 59

## 2020-04-24 ENCOUNTER — Ambulatory Visit: Payer: 59 | Admitting: Orthopedic Surgery

## 2020-04-24 DIAGNOSIS — M25512 Pain in left shoulder: Secondary | ICD-10-CM

## 2020-04-24 DIAGNOSIS — M79602 Pain in left arm: Secondary | ICD-10-CM

## 2020-04-26 ENCOUNTER — Encounter: Payer: Self-pay | Admitting: Orthopedic Surgery

## 2020-04-26 NOTE — Progress Notes (Signed)
Office Visit Note   Patient: Erin Morris           Date of Birth: Mar 29, 1986           MRN: [id number] Visit Date: 04/24/2020 Requested by: Erven Colla, DO [place],  [place] [address] PCP: Erven Colla, DO  Subjective: Chief Complaint  Patient presents with  . Left Shoulder - Pain    HPI: Erin Morris is a 34 year old patient with left shoulder pain.  She had some type of left shoulder procedure in 2008.  We look for those records and they are not available.  Pain is getting worse over the last 6 months.  She reports some pain radiating down the arm as well as some numbness and tingling in digits 4 and 5.  Worse when she is carrying her child.  States she has scapular pain as well.  She had surgery in 2008 for "subluxation".  Do not know if that was anterior or posterior subluxation.  She is able to voluntarily subluxate the shoulder posteriorly.              ROS: All systems reviewed are negative as they relate to the chief complaint within the history of present illness.  Patient denies  fevers or chills.   Assessment & Plan: Visit Diagnoses:  1. Acute pain of left shoulder   2. Left arm pain     Plan: Impression is objective posterior left shoulder instability.  She also has some ulnar nerve symptoms which I think may be originating from the elbow.  No subluxation of the ulnar nerve.  Plan is MRI arthrogram left shoulder to evaluate posterior subluxation for possible labral pathology.  Glenoid does not look retroverted on plain radiographs.  EMG nerve study left upper extremity to evaluate for cubital tunnel versus radiculopathy in the left arm.  Follow-up after those studies.  She has had 6 months of symptoms with failure of conservative treatment.  Follow-Up Instructions: Return for after MRI.   Orders:  Orders Placed This Encounter  Procedures  . XR Shoulder Left  . MR Shoulder Left w/ contrast  . Arthrogram  . Ambulatory referral to Physical Medicine  Rehab   No orders of the defined types were placed in this encounter.     Procedures: No procedures performed   Clinical Data: No additional findings.  Objective: Vital Signs: There were no vitals taken for this visit.  Physical Exam:   Constitutional: Patient appears well-developed HEENT:  Head: Normocephalic Eyes:EOM are normal Neck: Normal range of motion Cardiovascular: Normal rate Pulmonary/chest: Effort normal Neurologic: Patient is alert Skin: Skin is warm Psychiatric: Patient has normal mood and affect    Ortho Exam: Ortho exam demonstrates full active and passive range of motion of the left shoulder.  She has 2 and half plus posterior instability and subluxation compared to 1+ anterior.  Less than a centimeter sulcus sign.  Excellent rotator cuff strength infraspinatus supraspinatus subscap muscle testing.  She is able to voluntarily posteriorly subluxate the arm but this is not something that I think she does frequently or often.  No scapular dyskinesia with forward flexion.  Negative O'Brien's testing left and right.  Equivocal Tinel's in the cubital tunnel on the left.  5 out of 5 EPL FPL interosseous strength bilaterally.  Specialty Comments:  No specialty comments available.  Imaging: No results found.   PMFS History: Patient Active Problem List   Diagnosis Date Noted  . Pregnancy 11/25/2018  .  Encounter for elective induction of labor 11/24/2018  . Mixed hyperlipidemia 05/14/2009  . Coronary artery disease involving native coronary artery of native heart without angina pectoris 04/16/2009   Past Medical History:  Diagnosis Date  . Angio-edema   . Coronary atherosclerosis of native coronary artery    BMS to mid circumflex 2010  . Headache   . HPV in female   . NSTEMI (non-ST elevated myocardial infarction) ([place])    2010 - had prior workup for hypercoagulable state that was negative  . Urticaria   . Vaginal Pap smear, abnormal     Family  History  Problem Relation Age of Onset  . Thyroid disease Mother   . Heart disease Paternal Grandfather   . Colon cancer Paternal Grandmother     Past Surgical History:  Procedure Laterality Date  . addnoids    . ADENOIDECTOMY    . COLPOSCOPY    . LEFT HEART CATHETERIZATION WITH CORONARY ANGIOGRAM N/A 12/15/2013   Procedure: LEFT HEART CATHETERIZATION WITH CORONARY ANGIOGRAM;  Surgeon: Troy Sine, MD;  Location: [facility] CATH LAB;  Service: Cardiovascular;  Laterality: N/A;  . SHOULDER SURGERY Left   . TONSILLECTOMY    . WISDOM TOOTH EXTRACTION     Social History   Occupational History  . Occupation: radiology tech  Tobacco Use  . Smoking status: Never Smoker  . Smokeless tobacco: Never Used  Vaping Use  . Vaping Use: Never used  Substance and Sexual Activity  . Alcohol use: Yes    Alcohol/week: 0.0 standard drinks    Comment: maybe once a month  . Drug use: No  . Sexual activity: Yes    Birth control/protection: I.U.D.

## 2020-05-01 ENCOUNTER — Ambulatory Visit (HOSPITAL_COMMUNITY)
Admission: RE | Admit: 2020-05-01 | Discharge: 2020-05-01 | Disposition: A | Discharge status: Home / Self Care | Payer: 59 | Source: Ambulatory Visit | Attending: Orthopedic Surgery | Admitting: Orthopedic Surgery

## 2020-05-01 ENCOUNTER — Telehealth: Payer: Self-pay | Admitting: Orthopedic Surgery

## 2020-05-01 ENCOUNTER — Other Ambulatory Visit: Payer: Self-pay

## 2020-05-01 DIAGNOSIS — M25512 Pain in left shoulder: Secondary | ICD-10-CM | POA: Insufficient documentation

## 2020-05-01 DIAGNOSIS — M25312 Other instability, left shoulder: Secondary | ICD-10-CM | POA: Diagnosis not present

## 2020-05-01 DIAGNOSIS — Z9889 Other specified postprocedural states: Secondary | ICD-10-CM | POA: Diagnosis not present

## 2020-05-01 DIAGNOSIS — M19012 Primary osteoarthritis, left shoulder: Secondary | ICD-10-CM | POA: Diagnosis not present

## 2020-05-01 MED ORDER — GADOBUTROL 1 MMOL/ML IV SOLN
0.1000 mL | Freq: Once | INTRAVENOUS | Status: AC | PRN
  Administered 2020-05-01: 0.05 mL

## 2020-05-01 MED ORDER — IOHEXOL 180 MG/ML  SOLN
20.0000 mL | Freq: Once | INTRAMUSCULAR | Status: AC | PRN
  Administered 2020-05-01: 10 mL

## 2020-05-01 MED ORDER — SODIUM CHLORIDE (PF) 0.9 % IJ SOLN
INTRAMUSCULAR | Status: AC
  Administered 2020-05-01: 10 mL via INTRA_ARTICULAR
  Filled 2020-05-01: qty 10

## 2020-05-01 MED ORDER — LIDOCAINE HCL (PF) 1 % IJ SOLN
INTRAMUSCULAR | Status: AC
  Administered 2020-05-01: 5 mL via INTRA_ARTICULAR
  Filled 2020-05-01: qty 5

## 2020-05-01 MED ORDER — POVIDONE-IODINE 10 % EX SOLN
CUTANEOUS | Status: AC
  Filled 2020-05-01: qty 15

## 2020-05-01 NOTE — Telephone Encounter (Signed)
Called patient left voicemail message to return call to schedule and appointment for MRI review with Dr Marlou Sa    MRI schedule 05/01/2020

## 2020-05-01 NOTE — Procedures (Signed)
CLINICAL DATA: Acute left shoulder pain and instability.  Prior left shoulder surgery.  EXAM: Left SHOULDER INJECTION UNDER FLUOROSCOPY  TECHNIQUE:   I discussed the risks (including hemorrhage and infection), benefits, and alternatives to fluoroscopically guided left shoulder joint injection pre MRI with the patient.  We specifically discussed the high technical likelihood of success.  She understood and elected to undergo the procedure.  Standard time-out was employed. LEFT skin site marked.  An appropriate skin entrance site was determined at fluoroscopy.  The site was marked, prepped with Betadine, draped in the usual sterile fashion, and infiltrated locally with Lidocaine.  Careful attention to sterile technique was employed.  22 gauge spinal needle was advanced to the medial margin of the humeral head under intermittent fluoroscopy.  A small amount of contrast injected easily.  A total of 10 cc of a mixture of 0.05 ml Multihance, 10 ml of Omnipaque 180, and 10 cc of saline was then used to opacify the left shoulder joint.  No immediate complication.  FLUOROSCOPY TIME: Fluoroscopy Time:  0 minutes, 36 seconds  Radiation Exposure Index (if provided by the fluoroscopic device):  3.7 mGy  Number of Acquired Spot Images: 0  FINDINGS: Expected distribution of contrast medium in the left glenohumeral joint.  IMPRESSION: Technically successful left shoulder injection for MRI.

## 2020-05-13 ENCOUNTER — Ambulatory Visit: Payer: 59 | Admitting: Surgical

## 2020-05-13 DIAGNOSIS — M25312 Other instability, left shoulder: Secondary | ICD-10-CM | POA: Diagnosis not present

## 2020-05-19 ENCOUNTER — Encounter: Payer: Self-pay | Admitting: Surgical

## 2020-05-19 NOTE — Progress Notes (Signed)
Office Visit Note   Patient: Erin Morris           Date of Birth: 1985/10/10           MRN: [id number] Visit Date: 05/13/2020 Requested by: Erven Colla, DO [place],  [place] [address] PCP: Erven Colla, DO  Subjective: Chief Complaint  Patient presents with  . Left Shoulder - Follow-up    HPI: Erin Morris is a 34 y.o. female who presents to the office complaining of left shoulder pain.  Patient presents for MRI review.  MRI was read as normal without any finding to explain the patient's symptoms.  She complains of continued feelings of subluxation and instability.  She notes achiness all the time that is worse in the morning.  She notes symptoms have been present over the last 2 years.  She had a procedure in 2008 by Dr. Durward Fortes where her shoulder was "tightened up".  This procedure provided 10 years of relief.  She is able to voluntarily sublux her left shoulder.  She has no recent PT.  She has no history of recent injury.  She does note that she has had to decrease her time in the gym due to the pain and instability of her shoulder.  Symptoms are worse when her shoulder is out and away from her body..                ROS: All systems reviewed are negative as they relate to the chief complaint within the history of present illness.  Patient denies fevers or chills.  Assessment & Plan: Visit Diagnoses:  1. Instability of left shoulder joint     Plan: Patient is a 34 year old female presents complaining of left shoulder instability and pain.  She has had symptoms that have been worsening over the last 2 years.  No injury noted.  She does have a previous procedure in 2008 but she cannot recall exactly what that procedure was.  MRI of the left shoulder was read as normal.  This patient was discussed with Dr. Alphonzo Severance and MRI as interpreted by him shows a patulous posterior capsule that may be responsible for the patient's symptoms.  Regardless, plan on  outpatient physical therapy to work on rotator cuff strengthening for shoulder instability and then transition to home exercise program.  Follow-up in 6 weeks for clinical recheck.  Follow-Up Instructions: No follow-ups on file.   Orders:  Orders Placed This Encounter  Procedures  . Ambulatory referral to Occupational Therapy   No orders of the defined types were placed in this encounter.     Procedures: No procedures performed   Clinical Data: No additional findings.  Objective: Vital Signs: LMP 04/23/2020   Physical Exam:  Constitutional: Patient appears well-developed HEENT:  Head: Normocephalic Eyes:EOM are normal Neck: Normal range of motion Cardiovascular: Normal rate Pulmonary/chest: Effort normal Neurologic: Patient is alert Skin: Skin is warm Psychiatric: Patient has normal mood and affect  Ortho Exam: Ortho exam demonstrates left shoulder with comparable range of motion with contralateral side.  She does have excellent strength of infraspinatus, supraspinatus, subscapularis.  Significant 2+ posterior instability.  No tenderness palpation over the bicipital groove or AC joint.  Positive apprehension sign.  Specialty Comments:  No specialty comments available.  Imaging: No results found.   PMFS History: Patient Active Problem List   Diagnosis Date Noted  . Pregnancy 11/25/2018  . Encounter for elective induction of labor 11/24/2018  . Mixed hyperlipidemia  05/14/2009  . Coronary artery disease involving native coronary artery of native heart without angina pectoris 04/16/2009   Past Medical History:  Diagnosis Date  . Angio-edema   . Coronary atherosclerosis of native coronary artery    BMS to mid circumflex 2010  . Headache   . HPV in female   . NSTEMI (non-ST elevated myocardial infarction) ([place])    2010 - had prior workup for hypercoagulable state that was negative  . Urticaria   . Vaginal Pap smear, abnormal     Family History  Problem  Relation Age of Onset  . Thyroid disease Mother   . Heart disease Paternal Grandfather   . Colon cancer Paternal Grandmother     Past Surgical History:  Procedure Laterality Date  . addnoids    . ADENOIDECTOMY    . COLPOSCOPY    . LEFT HEART CATHETERIZATION WITH CORONARY ANGIOGRAM N/A 12/15/2013   Procedure: LEFT HEART CATHETERIZATION WITH CORONARY ANGIOGRAM;  Surgeon: Troy Sine, MD;  Location[facility] CATH LAB;  Service: Cardiovascular;  Laterality: N/A;  . SHOULDER SURGERY Left   . TONSILLECTOMY    . WISDOM TOOTH EXTRACTION     Social History   Occupational History  . Occupation: radiology tech  Tobacco Use  . Smoking status: Never Smoker  . Smokeless tobacco: Never Used  Vaping Use  . Vaping Use: Never used  Substance and Sexual Activity  . Alcohol use: Yes    Alcohol/week: 0.0 standard drinks    Comment: maybe once a month  . Drug use: No  . Sexual activity: Yes    Birth control/protection: I.U.D.

## 2020-05-29 ENCOUNTER — Encounter (HOSPITAL_COMMUNITY): Payer: Self-pay

## 2020-05-29 ENCOUNTER — Ambulatory Visit (HOSPITAL_COMMUNITY): Payer: 59 | Attending: Surgical

## 2020-05-29 ENCOUNTER — Other Ambulatory Visit: Payer: Self-pay

## 2020-05-29 DIAGNOSIS — R29898 Other symptoms and signs involving the musculoskeletal system: Secondary | ICD-10-CM | POA: Insufficient documentation

## 2020-05-29 DIAGNOSIS — G8929 Other chronic pain: Secondary | ICD-10-CM | POA: Insufficient documentation

## 2020-05-29 DIAGNOSIS — M25512 Pain in left shoulder: Secondary | ICD-10-CM | POA: Insufficient documentation

## 2020-05-29 NOTE — Patient Instructions (Signed)
1) (Home) Extension: Isometric / Bilateral Arm Retraction - Sitting   Facing anchor, hold hands and elbow at shoulder height, with elbow bent.  Pull arms back to squeeze shoulder blades together. Repeat 10-15 times. 1-2 times/day.   2) (Clinic) Extension / Flexion (Assist)   Face anchor, pull arms back, keeping elbow straight, and squeze shoulder blades together. Repeat 10-15 times. 1-2 times/day.   Copyright  VHI. All rights reserved.   3) (Home) Retraction: Row - Bilateral (Anchor)   Facing anchor, arms reaching forward, pull hands toward stomach, keeping elbows bent and at your sides and pinching shoulder blades together. PALMS UP Repeat 10-15 times. 1-2 times/day.     FREE WEIGHT FLEXION IN NEUTRAL ROTATION - Complete with both arms together  Start with your arms down by your side. While holding a free weight with your palm facing your side and your elbow straight, raise up your arms forward as shown then return to starting position and repeat.     FREE WEIGHT SCAPTION - Complete with both arms together  Slowly raise up your arm away from your side in a forward/lateral direction. Your elbows should be straight and movement to occur in the plane of the scapula or 45 degrees to the side.

## 2020-05-30 NOTE — Therapy (Signed)
[place], [place], [address] Phone: [phone]   Fax:  [phone]  Occupational Therapy Evaluation  Patient Details  Name: Erin Morris MRN: [id number] Date of Birth: 11/08/1985 Referring Provider (OT): Dr. Susa Day   Encounter Date: 05/29/2020   OT End of Session - 05/30/20 0924    Visit Number 1    Number of Visits 3    Date for OT Re-Evaluation 06/14/20    Authorization Type [facility]    Authorization Time Period copay $20. Authorization needed at 25th visit    OT Start Time 1602    OT Stop Time 1645    OT Time Calculation (min) 43 min    Activity Tolerance Patient tolerated treatment well    Behavior During Therapy [facility] for tasks assessed/performed           Past Medical History:  Diagnosis Date  . Angio-edema   . Coronary atherosclerosis of native coronary artery    BMS to mid circumflex 2010  . Headache   . HPV in female   . NSTEMI (non-ST elevated myocardial infarction) ([place])    2010 - had prior workup for hypercoagulable state that was negative  . Urticaria   . Vaginal Pap smear, abnormal     Past Surgical History:  Procedure Laterality Date  . addnoids    . ADENOIDECTOMY    . COLPOSCOPY    . LEFT HEART CATHETERIZATION WITH CORONARY ANGIOGRAM N/A 12/15/2013   Procedure: LEFT HEART CATHETERIZATION WITH CORONARY ANGIOGRAM;  Surgeon: Troy Sine, MD;  Location: [facility] CATH LAB;  Service: Cardiovascular;  Laterality: N/A;  . SHOULDER SURGERY Left   . TONSILLECTOMY    . WISDOM TOOTH EXTRACTION      There were no vitals filed for this visit.   Subjective Assessment - 05/29/20 1610    Subjective  S: I've had surgery before to tighen up my shoulder but recently it will feel like it's slipping out of joint.    Pertinent History Patient is a 34 y/o female S/P left shoulder instability with occssional feelings of joint slipping out. Surgical procedure completed by Dr. Virgil Benedict in  2008 that "tightened" the joint itself. Patient had 10 years of no issues. in 2019 she experienced a flare up which was exacerbated after giving birth in 2020 while holding her daughter on her left hip when carrying her.    Patient Stated Goals To increase shoulder stability.    Currently in Pain? Yes    Pain Score 3     Pain Location Scapula    Pain Orientation Left    Pain Descriptors / Indicators Aching    Pain Type Acute pain    Pain Radiating Towards N/A    Pain Onset More than a month ago    Pain Frequency Constant             [facility] OT Assessment - 05/29/20 1613      Assessment   Medical Diagnosis left shoulder instability    Referring Provider (OT) Dr. 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 Any movement where the shoulder/arm is extended or abducted away. Being able to move patients effectively.      Mobility   Mobility Status Independent      Written Expression   Dominant Hand Right      Vision - History   Baseline Vision Wears glasses all the time      Cognition   Overall Cognitive Status Within Functional Limits for tasks assessed      Observation/Other Assessments   Focus on Therapeutic Outcomes (FOTO)  complete next session      Posture/Postural  Control   Posture/Postural Control Postural limitations    Postural Limitations Rounded Shoulders;Forward head      ROM / Strength   AROM / PROM / Strength AROM;PROM;Strength      Palpation   Palpation comment Min fascial restrictions in left upper arm, trapezius, and scapularis region      AROM   Overall AROM  Within functional limits for tasks performed    Overall AROM Comments Assessed seated. IR/er abducted      Strength   Overall Strength Comments Assessed seated. IR/er abducted.  Middle trapezius and lower trapezius strength:  4/5. Lower  trapezius: 5/5    Strength Assessment Site Shoulder    Right/Left Shoulder Left    Left Shoulder Flexion 5/5    Left Shoulder ABduction 5/5    Left Shoulder Internal Rotation 5/5    Left Shoulder External Rotation 5/5                           OT Education - 05/30/20 0923    Education Details red theraband scapular strengthening and shoulder strengthening using 3# hand weights (flexion and scaption to shoulder level)    Person(s) Educated Patient    Methods Demonstration;Explanation;Handout;Verbal cues    Comprehension Returned demonstration;Verbalized understanding            OT Short Term Goals - 05/30/20 0942      OT SHORT TERM GOAL #1   Title Patient will be educated and independent with HEP in order to increase shoulder stability in her LUE and experience less pain/discomfort during work and home tasks.    Time 2    Period Weeks    Status New    Target Date 06/14/20      OT SHORT TERM GOAL #2   Title Patient will increase left middle trapezius strength to 4+/5  which allow her to hold/carry her daughter with a pain score of 5/10 or less.    Time 2    Period Weeks    Status New                    Plan - 05/30/20 0925    Clinical Impression Statement A: Patient is a 34 y/o female S/P LUE shoulder instability causing  increased pain, fascial restrictions and decreased scapular strengthening  resulting in difficulty when completing required work tasks and child  care duties.    OT Occupational Profile and History Detailed Assessment- Review of Records and additional review of physical, cognitive, psychosocial history related to current functional performance    Occupational performance deficits (Please refer to evaluation for details): ADL's;IADL's;Work    Body Structure / Function / Physical Skills Strength;Pain    Rehab Potential Excellent    Clinical Decision Making Limited treatment options, no task modification necessary    Comorbidities Affecting Occupational Performance: Presence of comorbidities impacting occupational performance    Comorbidities impacting occupational performance description: history of shoulder instability. History of heart attack    Modification or Assistance to Complete Evaluation  No modification of tasks or assist necessary to complete eval    OT Frequency 1x / week    OT Duration 2 weeks    OT Treatment/Interventions Self-care/ADL training;Ultrasound;Patient/family education;Cryotherapy;Electrical Stimulation;Moist Heat    Plan P: Patient will benefit from skilled OT services to increase functional performance and use of her LUE while completing work tasks and childcare activities. Treatment plan: Determine success of established HEP. Adjust if needed. Myofasical release if needed to LUE, focus on shoulder/scapular stability.    OT Home Exercise Plan eval: scapular strengthening with band. Hand weight shoulder strengthening; 3#; flexion, scaption    Consulted and Agree with Plan of Care Patient           Patient will benefit from skilled therapeutic intervention in order to improve the following deficits and impairments:   Body Structure / Function / Physical Skills: Strength, Pain       Visit Diagnosis: Other symptoms and signs involving the musculoskeletal system - Plan: Ot plan of care cert/re-cert  Chronic left shoulder pain - Plan: Ot plan  of care cert/re-cert    Problem List Patient Active Problem List   Diagnosis Date Noted  . Pregnancy 11/25/2018  . Encounter for elective induction of labor 11/24/2018  . Mixed hyperlipidemia 05/14/2009  . Coronary artery disease involving native coronary artery of native heart without angina pectoris 04/16/2009   Ailene Ravel, OTR/L,CBIS  [phone]  05/30/2020, 9:48 AM  [facility] [address] [place], [place], [address] Phone: [phone]   Fax:  [phone]  Name: KIVA NORLAND MRN: [id number] Date of Birth: October 15, 1985

## 2020-06-03 ENCOUNTER — Other Ambulatory Visit: Payer: Self-pay

## 2020-06-03 ENCOUNTER — Encounter: Payer: Self-pay | Admitting: Physical Medicine and Rehabilitation

## 2020-06-03 ENCOUNTER — Ambulatory Visit: Payer: 59 | Admitting: Physical Medicine and Rehabilitation

## 2020-06-03 DIAGNOSIS — R202 Paresthesia of skin: Secondary | ICD-10-CM

## 2020-06-03 DIAGNOSIS — G8929 Other chronic pain: Secondary | ICD-10-CM | POA: Diagnosis not present

## 2020-06-03 DIAGNOSIS — M25512 Pain in left shoulder: Secondary | ICD-10-CM

## 2020-06-03 NOTE — Progress Notes (Signed)
Numbness in fourth and fifth fingers of left hand. Seems positional. Worse at night and when driving. Right hand dominant. No lotion per patient. Numeric Pain Rating Scale and Functional Assessment Average Pain 4   In the last MONTH (on 0-10 scale) has pain interfered with the following?  1. General activity like being  able to carry out your everyday physical activities such as walking, climbing stairs, carrying groceries, or moving a chair?  Rating(2)

## 2020-06-04 ENCOUNTER — Encounter (HOSPITAL_COMMUNITY): Payer: 59 | Admitting: Specialist

## 2020-06-04 NOTE — Procedures (Signed)
EMG & NCV Findings: All nerve conduction studies (as indicated in the following tables) were within normal limits.    All examined muscles (as indicated in the following table) showed no evidence of electrical instability.    Impression: Essentially NORMAL electrodiagnostic study of the left upper limb.  There is no significant electrodiagnostic evidence of nerve entrapment, brachial plexopathy or cervical radiculopathy.    **As you know, purely sensory or demyelinating radiculopathies and chemical radiculitis may not be detected with this particular electrodiagnostic study.  Recommendations: 1.  Follow-up with referring physician. 2.  Continue current management of symptoms.  ___________________________ Laurence Spates FAAPMR Board Certified, American Board of Physical Medicine and Rehabilitation    Nerve Conduction Studies Anti Sensory Summary Table   Stim Site NR Peak (ms) Norm Peak (ms) P-T Amp (V) Norm P-T Amp Site1 Site2 Delta-P (ms) Dist (cm) Vel (m/s) Norm Vel (m/s)  Left Median Acr Palm Anti Sensory (2nd Digit)  30.5C  Wrist    3.3 <3.6 41.9 >10 Wrist Palm 1.6 0.0    Palm    1.7 <2.0 48.2         Left Radial Anti Sensory (Base 1st Digit)  30.6C  Wrist    2.1 <3.1 23.7  Wrist Base 1st Digit 2.1 0.0    Left Ulnar Anti Sensory (5th Digit)  30.8C  Wrist    3.3 <3.7 28.2 >15.0 Wrist 5th Digit 3.3 14.0 42 >38   Motor Summary Table   Stim Site NR Onset (ms) Norm Onset (ms) O-P Amp (mV) Norm O-P Amp Site1 Site2 Delta-0 (ms) Dist (cm) Vel (m/s) Norm Vel (m/s)  Left Median Motor (Abd Poll Brev)  30.7C  Wrist    3.4 <4.2 7.9 >5 Elbow Wrist 3.5 21.0 60 >50  Elbow    6.9  6.8         Left Ulnar Motor (Abd Dig Min)  30.9C  Wrist    2.7 <4.2 6.8 >3 B Elbow Wrist 3.2 19.0 59 >53  B Elbow    5.9  6.6  A Elbow B Elbow 1.4 10.0 71 >53  A Elbow    7.3  6.6          EMG   Side Muscle Nerve Root Ins Act Fibs Psw Amp Dur Poly Recrt Int Fraser Din Comment  Left 1stDorInt Ulnar C8-T1 Nml Nml  Nml Nml Nml 0 Nml Nml   Left Abd Poll Brev Median C8-T1 Nml Nml Nml Nml Nml 0 Nml Nml   Left ExtDigCom   Nml Nml Nml Nml Nml 0 Nml Nml   Left Triceps Radial C6-7-8 Nml Nml Nml Nml Nml 0 Nml Nml   Left Deltoid Axillary C5-6 Nml Nml Nml Nml Nml 0 Nml Nml     Nerve Conduction Studies Anti Sensory Left/Right Comparison   Stim Site L Lat (ms) R Lat (ms) L-R Lat (ms) L Amp (V) R Amp (V) L-R Amp (%) Site1 Site2 L Vel (m/s) R Vel (m/s) L-R Vel (m/s)  Median Acr Palm Anti Sensory (2nd Digit)  30.5C  Wrist 3.3   41.9   Wrist Palm     Palm 1.7   48.2         Radial Anti Sensory (Base 1st Digit)  30.6C  Wrist 2.1   23.7   Wrist Base 1st Digit     Ulnar Anti Sensory (5th Digit)  30.8C  Wrist 3.3   28.2   Wrist 5th Digit 42     Motor Left/Right Comparison  Stim Site L Lat (ms) R Lat (ms) L-R Lat (ms) L Amp (mV) R Amp (mV) L-R Amp (%) Site1 Site2 L Vel (m/s) R Vel (m/s) L-R Vel (m/s)  Median Motor (Abd Poll Brev)  30.7C  Wrist 3.4   7.9   Elbow Wrist 60    Elbow 6.9   6.8         Ulnar Motor (Abd Dig Min)  30.9C  Wrist 2.7   6.8   B Elbow Wrist 59    B Elbow 5.9   6.6   A Elbow B Elbow 71    A Elbow 7.3   6.6            Waveforms:

## 2020-06-04 NOTE — Progress Notes (Signed)
Erin Morris - 34 y.o. female MRN [id number]  Date of birth: 11/06/1985  Office Visit Note: Visit Date: 06/03/2020 PCP: Erven Colla, DO Referred by: Erven Colla, DO  Subjective: Chief Complaint  Patient presents with  . Left Hand - Numbness   HPI:  Erin Morris is a 34 y.o. female who comes in today at the request of Dr. Anderson Malta for electrodiagnostic study of the Left upper extremities.  Patient is Right hand dominant.  She reports a history of shoulder surgery in the past by Dr. Joni Fears but not really sure what was performed.  She stated at the time that she had the pain in her shoulder she was also getting this paresthesia numbness type sensation to the fourth and fifth digit.  After her shoulder surgery she reports this got much better.  She has been seen recently by Dr. Anderson Malta for again shoulder pain.  Those notes can be reviewed.  She again is having some numbness and paresthesia in the fourth and fifth digits on the left hand.  This seems to be somewhat positional but also worse at night and when driving.  No history of prior electrodiagnostic study.  No specific radicular symptoms down the arm but it could fit with shoulder pain and hand numbness.  She has started physical therapy at [facility].  She has not had any dry needling.  She does report some pain through the elbow.   ROS Otherwise per HPI.  Assessment & Plan: Visit Diagnoses:  1. Chronic left shoulder pain   2. Paresthesia of skin     Plan:  Impression: Essentially NORMAL electrodiagnostic study of the left upper limb.  There is no significant electrodiagnostic evidence of nerve entrapment, brachial plexopathy or cervical radiculopathy.    **As you know, purely sensory or demyelinating radiculopathies and chemical radiculitis may not be detected with this particular electrodiagnostic study.  Recommendations: 1.  Follow-up with referring physician. 2.  Continue current  management of symptoms. Consider trigger point myofascial apin with Myotomal referral.  Meds & Orders: No orders of the defined types were placed in this encounter.   Orders Placed This Encounter  Procedures  . NCV with EMG (electromyography)    Follow-up: Return for  Anderson Malta, M.D. as scheduled.   Procedures: No procedures performed  EMG & NCV Findings: All nerve conduction studies (as indicated in the following tables) were within normal limits.    All examined muscles (as indicated in the following table) showed no evidence of electrical instability.    Impression: Essentially NORMAL electrodiagnostic study of the left upper limb.  There is no significant electrodiagnostic evidence of nerve entrapment, brachial plexopathy or cervical radiculopathy.    **As you know, purely sensory or demyelinating radiculopathies and chemical radiculitis may not be detected with this particular electrodiagnostic study.  Recommendations: 1.  Follow-up with referring physician. 2.  Continue current management of symptoms.  ___________________________ Laurence Spates FAAPMR Board Certified, American Board of Physical Medicine and Rehabilitation    Nerve Conduction Studies Anti Sensory Summary Table   Stim Site NR Peak (ms) Norm Peak (ms) P-T Amp (V) Norm P-T Amp Site1 Site2 Delta-P (ms) Dist (cm) Vel (m/s) Norm Vel (m/s)  Left Median Acr Palm Anti Sensory (2nd Digit)  30.5C  Wrist    3.3 <3.6 41.9 >10 Wrist Palm 1.6 0.0    Palm    1.7 <2.0 48.2         Left  Radial Anti Sensory (Base 1st Digit)  30.6C  Wrist    2.1 <3.1 23.7  Wrist Base 1st Digit 2.1 0.0    Left Ulnar Anti Sensory (5th Digit)  30.8C  Wrist    3.3 <3.7 28.2 >15.0 Wrist 5th Digit 3.3 14.0 42 >38   Motor Summary Table   Stim Site NR Onset (ms) Norm Onset (ms) O-P Amp (mV) Norm O-P Amp Site1 Site2 Delta-0 (ms) Dist (cm) Vel (m/s) Norm Vel (m/s)  Left Median Motor (Abd Poll Brev)  30.7C  Wrist    3.4 <4.2 7.9 >5 Elbow  Wrist 3.5 21.0 60 >50  Elbow    6.9  6.8         Left Ulnar Motor (Abd Dig Min)  30.9C  Wrist    2.7 <4.2 6.8 >3 B Elbow Wrist 3.2 19.0 59 >53  B Elbow    5.9  6.6  A Elbow B Elbow 1.4 10.0 71 >53  A Elbow    7.3  6.6          EMG   Side Muscle Nerve Root Ins Act Fibs Psw Amp Dur Poly Recrt Int Fraser Din Comment  Left 1stDorInt Ulnar C8-T1 Nml Nml Nml Nml Nml 0 Nml Nml   Left Abd Poll Brev Median C8-T1 Nml Nml Nml Nml Nml 0 Nml Nml   Left ExtDigCom   Nml Nml Nml Nml Nml 0 Nml Nml   Left Triceps Radial C6-7-8 Nml Nml Nml Nml Nml 0 Nml Nml   Left Deltoid Axillary C5-6 Nml Nml Nml Nml Nml 0 Nml Nml     Nerve Conduction Studies Anti Sensory Left/Right Comparison   Stim Site L Lat (ms) R Lat (ms) L-R Lat (ms) L Amp (V) R Amp (V) L-R Amp (%) Site1 Site2 L Vel (m/s) R Vel (m/s) L-R Vel (m/s)  Median Acr Palm Anti Sensory (2nd Digit)  30.5C  Wrist 3.3   41.9   Wrist Palm     Palm 1.7   48.2         Radial Anti Sensory (Base 1st Digit)  30.6C  Wrist 2.1   23.7   Wrist Base 1st Digit     Ulnar Anti Sensory (5th Digit)  30.8C  Wrist 3.3   28.2   Wrist 5th Digit 42     Motor Left/Right Comparison   Stim Site L Lat (ms) R Lat (ms) L-R Lat (ms) L Amp (mV) R Amp (mV) L-R Amp (%) Site1 Site2 L Vel (m/s) R Vel (m/s) L-R Vel (m/s)  Median Motor (Abd Poll Brev)  30.7C  Wrist 3.4   7.9   Elbow Wrist 60    Elbow 6.9   6.8         Ulnar Motor (Abd Dig Min)  30.9C  Wrist 2.7   6.8   B Elbow Wrist 59    B Elbow 5.9   6.6   A Elbow B Elbow 71    A Elbow 7.3   6.6            Waveforms:             Clinical History: No specialty comments available.     Objective:  VS:  HT:    WT:   BMI:     BP:   HR: bpm  TEMP: ( )  RESP:  Physical Exam Musculoskeletal:        General: No swelling, tenderness or deformity.     Comments: Inspection reveals  no atrophy of the bilateral APB or FDI or hand intrinsics. There is no swelling, color changes, allodynia or dystrophic changes. There is 5  out of 5 strength in the bilateral wrist extension, finger abduction and long finger flexion. There is intact sensation to light touch in all dermatomal and peripheral nerve distributions.  There is a negative Hoffmann's test bilaterally.  Skin:    General: Skin is warm and dry.     Findings: No erythema or rash.  Neurological:     General: No focal deficit present.     Mental Status: She is alert and oriented to person, place, and time.     Motor: No weakness or abnormal muscle tone.     Coordination: Coordination normal.  Psychiatric:        Mood and Affect: Mood normal.        Behavior: Behavior normal.      Imaging: No results found.

## 2020-06-06 ENCOUNTER — Ambulatory Visit (HOSPITAL_COMMUNITY): Payer: 59

## 2020-06-06 ENCOUNTER — Other Ambulatory Visit: Payer: Self-pay

## 2020-06-06 ENCOUNTER — Encounter (HOSPITAL_COMMUNITY): Payer: Self-pay

## 2020-06-06 DIAGNOSIS — G8929 Other chronic pain: Secondary | ICD-10-CM | POA: Diagnosis not present

## 2020-06-06 DIAGNOSIS — R29898 Other symptoms and signs involving the musculoskeletal system: Secondary | ICD-10-CM | POA: Diagnosis not present

## 2020-06-06 DIAGNOSIS — M25512 Pain in left shoulder: Secondary | ICD-10-CM | POA: Diagnosis not present

## 2020-06-06 NOTE — Therapy (Addendum)
[place], [place], [address] Phone: [phone]   Fax:  [phone]  Occupational Therapy Treatment  Patient Details  Name: Erin Morris MRN: [id number] Date of Birth: 07-05-1986 Referring Provider (OT): Dr. Susa Day   Encounter Date: 06/06/2020   OT End of Session - 06/06/20 1447    Visit Number 2    Number of Visits 3    Date for OT Re-Evaluation 06/14/20    Authorization Type [facility]    Authorization Time Period copay $20. Authorization needed at 25th visit    OT Start Time 1348   pt arrived late   OT Stop Time 1428    OT Time Calculation (min) 40 min    Activity Tolerance Patient tolerated treatment well    Behavior During Therapy [facility] for tasks assessed/performed           Past Medical History:  Diagnosis Date  . Angio-edema   . Coronary atherosclerosis of native coronary artery    BMS to mid circumflex 2010  . Headache   . HPV in female   . NSTEMI (non-ST elevated myocardial infarction) ([place])    2010 - had prior workup for hypercoagulable state that was negative  . Urticaria   . Vaginal Pap smear, abnormal     Past Surgical History:  Procedure Laterality Date  . addnoids    . ADENOIDECTOMY    . COLPOSCOPY    . LEFT HEART CATHETERIZATION WITH CORONARY ANGIOGRAM N/A 12/15/2013   Procedure: LEFT HEART CATHETERIZATION WITH CORONARY ANGIOGRAM;  Surgeon: Troy Sine, MD;  Location: [facility] CATH LAB;  Service: Cardiovascular;  Laterality: N/A;  . SHOULDER SURGERY Left   . TONSILLECTOMY    . WISDOM TOOTH EXTRACTION      There were no vitals filed for this visit.   Subjective Assessment - 06/06/20 1352    Subjective  S: I picked up my daughter across my body last night and it really irritated my shoulder on the front    Currently in Pain? Yes    Pain Score 4     Pain Location Shoulder    Pain Orientation Left    Pain Descriptors / Indicators Aching;Sore    Pain Type Acute pain               [facility] OT Assessment - 06/06/20 1354      Assessment   Medical Diagnosis left shoulder instability      Precautions   Precautions None                    OT Treatments/Exercises (OP) - 06/06/20 1354      Exercises   Exercises Shoulder      Shoulder Exercises: Prone   Other Prone Exercises Hughstons 1, 3, 4. 10X each      Shoulder Exercises: ROM/Strengthening   Other ROM/Strengthening Exercises Green band loop. Wall walks up/down, left/right. Wall slides up/down. 10X each.      Manual Therapy   Manual Therapy Soft tissue mobilization    Manual therapy comments Completed separate from rest of tx    Soft tissue mobilization Soft tissue mobilization applied to the upper arm, trapezius, and scapularis region to decrease fascial restrictions for increased mobility in a pain free zone                    OT Short Term Goals - 06/06/20 1503      OT SHORT  TERM GOAL #1   Title Patient will be educated and independent with HEP in order to increase shoulder stability in her LUE and experience less pain/discomfort during work and home tasks.    Time 2    Period Weeks    Status On-going    Target Date 06/14/20      OT SHORT TERM GOAL #2   Title Patient will increase left middle trapezius strength to 4+/5  which allow her to hold/carry her daughter with a pain score of 5/10 or less.    Time 2    Period Weeks    Status On-going                    Plan - 06/06/20 1448    Clinical Impression Statement A: Completed soft tissue mobilization to LUE with mod to max fasical restrictions palpated on trapezius and scapularis region. Patient participated in hughston's prone exercises, followed by wall walks and wall slides to promote proximal shoulder strength, with verbal and visual cues required for position and form.    Body Structure / Function / Physical Skills Strength;Pain    Plan P: Plan for discharge, follow up on new HEP, teach  self-myofascial techniques that patient can do at home to decrease trigger points and mod to max fascial restrictions on trap and scapula, arms on fire, ball on wall, UBE bike    OT Home Exercise Plan eval: scapular strengthening with band. Hand weight shoulder strengthening; 3#; flexion, scaption, 9/16: Hughston's 1, 3, 4, and green band loop for wall walks and wall slides    Consulted and Agree with Plan of Care Patient           Patient will benefit from skilled therapeutic intervention in order to improve the following deficits and impairments:   Body Structure / Function / Physical Skills: Strength, Pain       Visit Diagnosis: Other symptoms and signs involving the musculoskeletal system  Chronic left shoulder pain    Problem List Patient Active Problem List   Diagnosis Date Noted  . Pregnancy 11/25/2018  . Encounter for elective induction of labor 11/24/2018  . Mixed hyperlipidemia 05/14/2009  . Coronary artery disease involving native coronary artery of native heart without angina pectoris 04/16/2009     [place], [place] Student [phone]   06/06/2020, 3:17 PM  [place] [address] [place], [place], [address] Phone: [phone]   Fax:  [phone]  Name: Erin Morris MRN: [id number] Date of Birth: 02/18/1986

## 2020-06-06 NOTE — Patient Instructions (Signed)
1) Wall walks - upward and back down 10-12 times (up and down)  Walk your hand up the wall in a diagonal direction, going against the resistance of the band.    2) Lateral Wall Walks 10-12 times (left then right)  Stand at wall. Put band around both forearms and pull tight. Start with both hands on wall with arms straight and at shoulder height. Keep shoulders back. Step side with R hand and R foot together to walk sideways across the wall. Keep tension in the band as you step L hand and L foot to follow.    Try to avoid tension in upper shoulder/neck muscles!   3) SERRATUS WALL SLIDE - ELASTIC BAND  Place an elastic band around your arms at the level of your wrists as shown. Next, place your forearms and hands along a wall so that your elbows are bent and your arms point towards the ceiling.   Then, protract your shoulder blades forward and then slide your arms up the wall as shown.   Return to the original position and repeat.      4) Overall 10 times each Hughston's- 90 Thumb Up  Lay on stomach with arm hanging off of the edge. Relax muscles in neck. Keeping elbow straight, lift arm straight out to side with thumb up and pull shoulder blade back. Slowly return to relaxed, hanging position.   5) Hughston's- 120 Thumb Up  Lay on stomach with arm hanging off of the edge. Relax muscles in neck. Keeping elbow straight and thumb up, lift arm up at an angle. Slowly return to relaxed, hanging position.    6) Hughston's- 42 Palm Down  Lay on stomach with arm hanging off of the edge. Relax muscles in neck. First, keeping elbow straight, squeeze shoulder blade back (towards the opposite one). Second, lift arm straight out to side with palm down and elbow straight, all while keeping shoulder blade back. Slowly return to relaxed, hanging position.

## 2020-06-14 ENCOUNTER — Other Ambulatory Visit: Payer: Self-pay

## 2020-06-14 ENCOUNTER — Encounter (HOSPITAL_COMMUNITY): Payer: Self-pay | Admitting: Occupational Therapy

## 2020-06-14 ENCOUNTER — Ambulatory Visit (HOSPITAL_COMMUNITY): Payer: 59 | Admitting: Occupational Therapy

## 2020-06-14 DIAGNOSIS — G8929 Other chronic pain: Secondary | ICD-10-CM

## 2020-06-14 DIAGNOSIS — R29898 Other symptoms and signs involving the musculoskeletal system: Secondary | ICD-10-CM

## 2020-06-14 DIAGNOSIS — M25512 Pain in left shoulder: Secondary | ICD-10-CM

## 2020-06-14 NOTE — Therapy (Addendum)
[place], [place], [address] Phone: [phone]   Fax:  [phone]  Occupational Therapy Treatment  Patient Details  Name: Erin Morris MRN: [id number] Date of Birth: 09/25/1985 Referring Provider (OT): Dr. Susa Day   OCCUPATIONAL THERAPY DISCHARGE SUMMARY  Visits from Start of Care: 3  Current functional level related to goals / functional outcomes: Unknown. Pt did not return to therapy after MD appt.   Remaining deficits: Unknown.    Education / Equipment: HEP Plan: Patient agrees to discharge.  Patient goals were partially met. Patient is being discharged due to not returning since the last visit.  ?????      Encounter Date: 06/14/2020   OT End of Session - 06/14/20 1719    Visit Number 3    Number of Visits 3    Date for OT Re-Evaluation 06/14/20    Authorization Type [facility]    Authorization Time Period copay $20. Authorization needed at 25th visit    OT Start Time 1346    OT Stop Time 1426    OT Time Calculation (min) 40 min    Activity Tolerance Patient tolerated treatment well    Behavior During Therapy [facility] for tasks assessed/performed           Past Medical History:  Diagnosis Date  . Angio-edema   . Coronary atherosclerosis of native coronary artery    BMS to mid circumflex 2010  . Headache   . HPV in female   . NSTEMI (non-ST elevated myocardial infarction) ([place])    2010 - had prior workup for hypercoagulable state that was negative  . Urticaria   . Vaginal Pap smear, abnormal     Past Surgical History:  Procedure Laterality Date  . addnoids    . ADENOIDECTOMY    . COLPOSCOPY    . LEFT HEART CATHETERIZATION WITH CORONARY ANGIOGRAM N/A 12/15/2013   Procedure: LEFT HEART CATHETERIZATION WITH CORONARY ANGIOGRAM;  Surgeon: Troy Sine, MD;  Location: [facility] CATH LAB;  Service: Cardiovascular;  Laterality: N/A;  . SHOULDER SURGERY Left   . TONSILLECTOMY    .  WISDOM TOOTH EXTRACTION      There were no vitals filed for this visit.   Subjective Assessment - 06/14/20 1343    Subjective  S: I think the exercises have helped but I still have some discomfort.    Currently in Pain? Yes    Pain Score 2     Pain Location Shoulder    Pain Orientation Left    Pain Descriptors / Indicators Aching;Sore    Pain Type Acute pain    Pain Radiating Towards N/A    Pain Onset More than a month ago    Pain Frequency Intermittent    Aggravating Factors  work movements    Pain Relieving Factors rest    Effect of Pain on Daily Activities min effect on ADLs    Multiple Pain Sites No              [facility] OT Assessment - 06/14/20 1343      Assessment   Medical Diagnosis left shoulder instability      Precautions   Precautions None      Strength   Overall Strength Comments Middle trapezius strength 4/5, same as previous                    OT Treatments/Exercises (OP) - 06/14/20 1349  Exercises   Exercises Shoulder      Shoulder Exercises: Supine   Protraction PROM;5 reps    Horizontal ABduction PROM;5 reps    External Rotation PROM;5 reps    Internal Rotation PROM;5 reps    Flexion PROM;5 reps    ABduction PROM;5 reps      Shoulder Exercises: Prone   Other Prone Exercises Plank work: bird dog 30", straight arm push up from knee position 10X, shoulder taps from modified straight arm plank position 10X      Shoulder Exercises: ROM/Strengthening   Ball on Wall 1' flexion 1' abduction    Other ROM/Strengthening Exercises Arms on Fire: 2' total, 15" holds for each of 4 positions    Other ROM/Strengthening Exercises ABC holdling green weighted ball at 90 degrees flexion      Manual Therapy   Manual Therapy Soft tissue mobilization    Manual therapy comments Completed separate from rest of tx    Soft tissue mobilization Soft tissue mobilization applied to the upper arm, trapezius, and scapularis region to decrease fascial  restrictions for increased mobility in a pain free zone                  OT Education - 06/14/20 1719    Education Details stability exercises: plank work, ball on wall, arms on fire, ABCs with weight; educated on Kinesiotaping for shoulder stability    Person(s) Educated Patient    Methods Demonstration;Explanation;Handout;Verbal cues    Comprehension Returned demonstration;Verbalized understanding            OT Short Term Goals - 06/14/20 1721      OT SHORT TERM GOAL #1   Title Patient will be educated and independent with HEP in order to increase shoulder stability in her LUE and experience less pain/discomfort during work and home tasks.    Time 2    Period Weeks    Status Achieved    Target Date 06/14/20      OT SHORT TERM GOAL #2   Title Patient will increase left middle trapezius strength to 4+/5  which allow her to hold/carry her daughter with a pain score of 5/10 or less.    Time 2    Period Weeks    Status On-going                    Plan - 06/14/20 1721    Clinical Impression Statement A: Reassessment completed this session. Pt has been completing HEP and reports improvement in scapular stability and pain, however continues to have instability with work related motions and when carrying her daughter. Added shoulder stability work today including plank work, ball on wall, arms on fire, and weighted ABCs, pt reports feeling LUE weakness compared to RUE during exercises. Updated HEP to reflect new exercises. Pt returns to MD on 10/4 and would like to try these home exercises. Discussed progress and pt is agreeable to hold therapy until after MD appt, and if she is feeling good about her HEP and shoulder strength/stability we will discharge.    Body Structure / Function / Physical Skills Strength;Pain    Plan P: Hold therapy until after MD appt and trial of updated HEP    OT Home Exercise Plan eval: scapular strengthening with band. Hand weight shoulder  strengthening; 3#; flexion, scaption, 9/16: Hughston's 1, 3, 4, and green band loop for wall walks and wall slides; 9/24: shoulder stability-plank work, ball on wall, arms on fire, kinesiotaping  Consulted and Agree with Plan of Care Patient           Patient will benefit from skilled therapeutic intervention in order to improve the following deficits and impairments:   Body Structure / Function / Physical Skills: Strength, Pain       Visit Diagnosis: Other symptoms and signs involving the musculoskeletal system  Chronic left shoulder pain    Problem List Patient Active Problem List   Diagnosis Date Noted  . Pregnancy 11/25/2018  . Encounter for elective induction of labor 11/24/2018  . Mixed hyperlipidemia 05/14/2009  . Coronary artery disease involving native coronary artery of native heart without angina pectoris 04/16/2009   Guadelupe Sabin, OTR/L  [phone] 06/14/2020, 5:26 PM  [place] [address] [place], [place], [address] Phone: [phone]   Fax:  [phone]  Name: Erin Morris MRN: [id number] Date of Birth: 27-May-1986

## 2020-06-14 NOTE — Patient Instructions (Signed)
1) Shoulder ABCs While standing and holding a small ball or free weight, write out the alphabet in the air with your arm. Only your arm should be moving as you perform this.    2) [place] Dogs-30 second holds (alternating arms/legs) Hold a plank position in full elbow extension position with your legs spread slightly apart as shown. Do not let your back arch down. While holding this position, raise one arm up and then set it back down. Then perform on the opposite arm and repeat.    3) Alternating Shoulder Taps Place hands on an appropriate height such as a counter or for more advanced the floor. Alternate shoulder taps without rotating trunk    4) High plank push ups, 10X Start in a push up position on your hands and toes with elbows fully extended as shown. Try and maintain a straight spine. Do not allow your hips or pelvis on either side to drop. Maintain pelvic neutral position the entire time. Complete a straight arm push up.      5) Ball on the wall: standing facing the wall, hold ball on a door or wall at shoulder height and roll for 1 minute. Turn sideways and complete for 1 minute.   6) Arms on Fire: Standing with arms out to the side at shoulder height, complete the following 4 exercises for 15 seconds each for 2 minutes total. Increase the time as it gets easier  -Up and down -front to back -circle to the front -circle to the back

## 2020-06-24 ENCOUNTER — Ambulatory Visit (INDEPENDENT_AMBULATORY_CARE_PROVIDER_SITE_OTHER): Payer: 59 | Admitting: Orthopedic Surgery

## 2020-06-24 ENCOUNTER — Other Ambulatory Visit: Payer: Self-pay

## 2020-06-24 ENCOUNTER — Encounter: Payer: Self-pay | Admitting: Orthopedic Surgery

## 2020-06-24 DIAGNOSIS — M25312 Other instability, left shoulder: Secondary | ICD-10-CM | POA: Diagnosis not present

## 2020-06-24 NOTE — Progress Notes (Signed)
Office Visit Note   Patient: Erin Morris           Date of Birth: 1985/10/07           MRN: [id number] Visit Date: 06/24/2020 Requested by: Erven Colla, DO [place],  [place] [id number] PCP: Erven Colla, DO  Subjective: Chief Complaint  Patient presents with  . Left Shoulder - Pain    HPI: Erin Morris is a 34 y.o. female who presents to the office complaining of left shoulder pain. Patient returns for evaluation of left shoulder pain with posterior instability. She finished her 6 weeks of physical therapy and now she is only doing home exercises. She notes that physical therapy helped a good amount with her posterior scapular pain but she notes continued discomfort with some feeling of instability when she brings her arm up and away from her body. She notes these symptoms are significantly worse when she lifts things especially her toddler. She notes that particularly when she performs circles with the arms in abduction in her left arm fatigues much more quickly. She has history of a 2008 surgery by Dr. Durward Fortes on her left shoulder where she notes her left shoulder was "tightened up". She is unsure exactly of what the surgery entailed..                ROS: All systems reviewed are negative as they relate to the chief complaint within the history of present illness.  Patient denies fevers or chills.  Assessment & Plan: Visit Diagnoses:  1. Instability of left shoulder joint     Plan: Patient is a 34 year old female who presents complaining of left shoulder discomfort. She has had left shoulder MRI that was normal aside from what appeared to be a loosening of the posterior capsule. She does have posterior instability on exam. She has continued discomfort that was slightly improved by physical therapy but not to a degree that she deemed satisfactory. She is trying to determine whether or not she wants to proceed with surgery. Plan to continue physical therapy  with follow-up in 2 months. Plan to make determination for or against surgery at that point. Patient agreed with plan. Follow-up in 8 weeks.  Follow-Up Instructions: No follow-ups on file.   Orders:  No orders of the defined types were placed in this encounter.  No orders of the defined types were placed in this encounter.     Procedures: No procedures performed   Clinical Data: No additional findings.  Objective: Vital Signs: There were no vitals taken for this visit.  Physical Exam:  Constitutional: Patient appears well-developed HEENT:  Head: Normocephalic Eyes:EOM are normal Neck: Normal range of motion Cardiovascular: Normal rate Pulmonary/chest: Effort normal Neurologic: Patient is alert Skin: Skin is warm Psychiatric: Patient has normal mood and affect  Ortho Exam: Ortho exam demonstrates left shoulder with excellent range of motion that is comparable to the contralateral side. 5/5 motor strength of the bilateral supraspinatus, infraspinatus, subscapularis. No significant crepitus noted with passive range of motion of the left shoulder. There is significant posterior laxity of the left shoulder with clunking sensation. The sensation is reproducible. No sexual activity on the right shoulder. No anterior laxity of either shoulder.  Specialty Comments:  No specialty comments available.  Imaging: No results found.   PMFS History: Patient Active Problem List   Diagnosis Date Noted  . Pregnancy 11/25/2018  . Encounter for elective induction of labor 11/24/2018  . Mixed hyperlipidemia  05/14/2009  . Coronary artery disease involving native coronary artery of native heart without angina pectoris 04/16/2009   Past Medical History:  Diagnosis Date  . Angio-edema   . Coronary atherosclerosis of native coronary artery    BMS to mid circumflex 2010  . Headache   . HPV in female   . NSTEMI (non-ST elevated myocardial infarction) ([place])    2010 - had prior workup for  hypercoagulable state that was negative  . Urticaria   . Vaginal Pap smear, abnormal     Family History  Problem Relation Age of Onset  . Thyroid disease Mother   . Heart disease Paternal Grandfather   . Colon cancer Paternal Grandmother     Past Surgical History:  Procedure Laterality Date  . addnoids    . ADENOIDECTOMY    . COLPOSCOPY    . LEFT HEART CATHETERIZATION WITH CORONARY ANGIOGRAM N/A 12/15/2013   Procedure: LEFT HEART CATHETERIZATION WITH CORONARY ANGIOGRAM;  Surgeon: Troy Sine, MD;  Location[facility] C[facility] LAB;  Service: Cardiovascular;  Laterality: N/A;  . SHOULDER SURGERY Left   . TONSILLECTOMY    . WISDOM TOOTH EXTRACTION     Social History   Occupational History  . Occupation: radiology tech  Tobacco Use  . Smoking status: Never Smoker  . Smokeless tobacco: Never Used  Vaping Use  . Vaping Use: Never used  Substance and Sexual Activity  . Alcohol use: Yes    Alcohol/week: 0.0 standard drinks    Comment: maybe once a month  . Drug use: No  . Sexual activity: Yes    Birth control/protection: I.U.D.

## 2020-07-01 ENCOUNTER — Ambulatory Visit: Payer: 59 | Admitting: Cardiology

## 2020-07-01 ENCOUNTER — Encounter: Payer: Self-pay | Admitting: Cardiology

## 2020-07-01 VITALS — BP 110/72 | HR 73 | Ht 67.0 in | Wt 206.4 lb

## 2020-07-01 DIAGNOSIS — I2510 Atherosclerotic heart disease of native coronary artery without angina pectoris: Secondary | ICD-10-CM | POA: Diagnosis not present

## 2020-07-01 DIAGNOSIS — E782 Mixed hyperlipidemia: Secondary | ICD-10-CM

## 2020-07-01 NOTE — Patient Instructions (Signed)
Medication Instructions:   Your physician recommends that you continue on your current medications as directed. Please refer to the Current Medication list given to you today.  Labwork: Your physician recommends that you return for a FASTING lipid profile: as soon as possible. Please do not eat or drink for at least 8 hours when you have this done. You may take your medications that morning with a sip of water.  Testing/Procedures:  None  Follow-Up:  Your physician recommends that you schedule a follow-up appointment in: 1 year. You will receive a reminder letter in the mail in about 10 months reminding you to call and schedule your appointment. If you don't receive this letter, please contact our office.  Any Other Special Instructions Will Be Listed Below (If Applicable).  If you need a refill on your cardiac medications before your next appointment, please call your pharmacy.

## 2020-07-01 NOTE — Progress Notes (Signed)
Cardiology Office Note  Date: 07/01/2020   ID: Erin Morris, DOB 10/12/1985, MRN [id number]  PCP:  Erven Colla, DO  Cardiologist:  [name], MD Electrophysiologist:  None   Chief Complaint  Patient presents with  . Cardiac follow-up    History of Present Illness: Erin Morris is a 34 y.o. female last seen in September 2020.  She is here for a routine visit, continues to do well without any active angina or nitroglycerin use.  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  I went over her medications which are listed below.  She is due for a follow-up lipid panel which we will arrange.  I personally reviewed her ECG today which shows normal sinus rhythm.  Past Medical History:  Diagnosis Date  . Angio-edema   . Coronary atherosclerosis of native coronary artery    BMS to mid circumflex 2010  . Headache   . HPV in female   . NSTEMI (non-ST elevated myocardial infarction) ([place])    2010 - had prior workup for hypercoagulable state that was negative  . Urticaria   . Vaginal Pap smear, abnormal     Past Surgical History:  Procedure Laterality Date  . addnoids    . ADENOIDECTOMY    . COLPOSCOPY    . LEFT HEART CATHETERIZATION WITH CORONARY ANGIOGRAM N/A 12/15/2013   Procedure: LEFT HEART CATHETERIZATION WITH CORONARY ANGIOGRAM;  Surgeon: Troy Sine, MD;  Location: [facility] CATH LAB;  Service: Cardiovascular;  Laterality: N/A;  . SHOULDER SURGERY Left   . TONSILLECTOMY    . WISDOM TOOTH EXTRACTION      Current Outpatient Medications  Medication Sig Dispense Refill  . aspirin 81 MG EC tablet Take 81 mg by mouth daily.      Marland Kitchen loratadine (CLARITIN) 10 MG tablet Take 10 mg by mouth daily.    . nitroGLYCERIN (NITROSTAT) 0.4 MG SL tablet Place 1 tablet (0.4 mg total) under the tongue every 5 (five) minutes as needed for chest pain. 25 tablet 3  . paragard intrauterine copper IUD IUD ParaGard T 380A  380 square mm intrauterine device  Take 1 device by intrauterine route.    . simvastatin (ZOCOR) 5 MG tablet Take 1 tablet (5 mg total) by mouth every other day. 45 tablet 3   No current facility-administered medications for this visit.   Allergies:  Nsaids   ROS: No syncope.  Physical Exam: VS:  BP 110/72   Pulse 73   Ht 5\' 7"  (1.702 m)   Wt 206 lb 6.4 oz (93.6 kg)   SpO2 98%   BMI 32.33 kg/m , BMI Body mass index is 32.33 kg/m.  Wt Readings from Last 3 Encounters:  07/01/20 206 lb 6.4 oz (93.6 kg)  12/29/19 201 lb (91.2 kg)  06/20/19 193 lb (87.5 kg)    General: Patient appears comfortable at rest. HEENT: Conjunctiva and lids normal, wearing a mask. Neck: Supple, no elevated JVP or carotid bruits, no thyromegaly. Lungs: Clear to auscultation, nonlabored breathing at rest. Cardiac: Regular rate and rhythm, no S3 or significant systolic murmur, no pericardial rub. Extremities: No pitting edema.  ECG:  An ECG dated 06/20/2019 was personally reviewed today and demonstrated:  Sinus rhythm with PVC.  Recent Labwork:    Component Value Date/Time   CHOL 120 03/21/2019 0842   TRIG 57 03/21/2019 0842   HDL 55 03/21/2019 0842   CHOLHDL 2.2 03/21/2019 1572  CHOLHDL 3 03/02/2018 0718   VLDL 11.6 03/02/2018 0718   LDLCALC 54 03/21/2019 0842    Other Studies Reviewed Today:  Cardiac catheterization 12/15/2013: HEMODYNAMICS:  Central Aorta: 86/51  Left Ventricle: 86/2  ANGIOGRAPHY:  The left main coronary artery was angiographically normal and bifurcated into the LAD and left circumflex vessel.  The left anterior descending artery was angiographically and extended to the LV apex.   The left circumflex was a large caliber vessel. This gave rise to a very high marginal branch. The proximal circumflex had a widely patent stent extended into the second marginal branch. The stent was widely patent without evidence for restenosis or any residual narrowing.  The RCA  was angiographically normal it gave rise to the PDA and small posterolateral branch.  Left ventricular function was in the low-normal range. Visualization was suboptimal to truly delineate any wall motion abnormality definitively.  Assessment and Plan:  CAD status post BMS to the circumflex in 2010.  She remains symptomatically stable without active angina on medical therapy, ECG is normal today.  Cardiac catheterization in 2015 revealed patent stent site.  Continue aspirin and low-dose Zocor.  Repeat FLP.  She continues to exercise regularly.  Medication Adjustments/Labs and Tests Ordered: Current medicines are reviewed at length with the patient today.  Concerns regarding medicines are outlined above.   Tests Ordered: Orders Placed This Encounter  Procedures  . Lipid panel    Medication Changes: No orders of the defined types were placed in this encounter.   Disposition:  Follow up 1 year.  Signed, Satira Sark, MD, [facility] 07/01/2020 4:42 PM    [facility] [address], [place], [place] [address] Phone: [phone]; Fax: [phone]

## 2020-07-02 NOTE — Addendum Note (Signed)
Addended by: Merlene Laughter on: 07/02/2020 07:39 AM   Modules accepted: Orders

## 2020-07-03 DIAGNOSIS — Z135 Encounter for screening for eye and ear disorders: Secondary | ICD-10-CM | POA: Diagnosis not present

## 2020-07-03 DIAGNOSIS — H52223 Regular astigmatism, bilateral: Secondary | ICD-10-CM | POA: Diagnosis not present

## 2020-07-03 DIAGNOSIS — H5213 Myopia, bilateral: Secondary | ICD-10-CM | POA: Diagnosis not present

## 2020-08-01 ENCOUNTER — Telehealth: Payer: Self-pay | Admitting: *Deleted

## 2020-08-01 ENCOUNTER — Other Ambulatory Visit: Payer: Self-pay | Admitting: Cardiology

## 2020-08-01 ENCOUNTER — Other Ambulatory Visit (HOSPITAL_COMMUNITY)
Admission: RE | Admit: 2020-08-01 | Discharge: 2020-08-01 | Disposition: A | Discharge status: Home / Self Care | Payer: 59 | Source: Ambulatory Visit | Attending: Cardiology | Admitting: Cardiology

## 2020-08-01 DIAGNOSIS — E782 Mixed hyperlipidemia: Secondary | ICD-10-CM | POA: Insufficient documentation

## 2020-08-01 LAB — LIPID PANEL
Cholesterol: 103 mg/dL (ref 0–200)
HDL: 51 mg/dL (ref >40)
LDL Cholesterol: 46 mg/dL (ref 0–99)
Total CHOL/HDL Ratio: 2 RATIO
Triglycerides: 28 mg/dL (ref <150)
VLDL: 6 mg/dL (ref 0–40)

## 2020-08-01 MED FILL — SIMVASTATIN 5 MG TABLET: 5 | 90 days supply | Qty: 45 | Fill #0

## 2020-08-01 NOTE — Telephone Encounter (Signed)
Patient informed. Copy sent to PCP °

## 2020-08-01 NOTE — Telephone Encounter (Signed)
-----   Message from Satira Sark, MD sent at 08/01/2020 11:41 AM EST ----- Results reviewed. Lipids continue to look very good. LDL 46.

## 2020-08-04 ENCOUNTER — Other Ambulatory Visit: Payer: Self-pay | Admitting: Physician Assistant

## 2020-08-04 ENCOUNTER — Encounter: Payer: Self-pay | Admitting: Physician Assistant

## 2020-08-04 ENCOUNTER — Telehealth: Payer: 59 | Admitting: Physician Assistant

## 2020-08-04 DIAGNOSIS — R059 Cough, unspecified: Secondary | ICD-10-CM

## 2020-08-04 DIAGNOSIS — Z20822 Contact with and (suspected) exposure to covid-19: Secondary | ICD-10-CM | POA: Diagnosis not present

## 2020-08-04 DIAGNOSIS — G4489 Other headache syndrome: Secondary | ICD-10-CM | POA: Diagnosis not present

## 2020-08-04 DIAGNOSIS — J029 Acute pharyngitis, unspecified: Secondary | ICD-10-CM | POA: Diagnosis not present

## 2020-08-04 MED ORDER — FLUTICASONE PROPIONATE 50 MCG/ACT NA SUSP: 2.0000 | Freq: Every day | NASAL | 6 refills | Status: DC

## 2020-08-04 MED ORDER — BENZONATATE 100 MG PO CAPS: 100.0000 mg | ORAL_CAPSULE | Freq: Three times a day (TID) | ORAL | 0 refills | Status: DC | PRN

## 2020-08-04 NOTE — Progress Notes (Signed)
E-Visit for Corona Virus Screening  Your current symptoms could be consistent with the coronavirus.  Many health care providers can now test patients at their office but not all are.  [facility] has multiple testing sites. For information on our [place] testing locations and hours go to [email or web address]  We are enrolling you in our [place] for [place] . Daily you will receive a questionnaire within the [place] website. Our COVID 19 response team will be monitoring your responses daily.  Testing Information: The COVID-19 Community Testing sites are testing BY APPOINTMENT ONLY.  You can schedule online at [email or web address]  If you do not have access to a smart phone or computer you may call [phone] for an appointment.   Additional testing sites in the Community:  . For [facility] Testing sites in [facility]  FaceUpdate.uy  . For Pop-up testing sites in [place]  BowlDirectory.co.uk  . For [facility] BasicJet.[email or web address]  . For [facility] testing in [place] and [employer] [email or web address]  . For [facility] testing in [facility]   [email or web address]  For  more information about community testing call [phone]   Please quarantine yourself while awaiting your test results. Please stay home for a minimum of 10 days from the first day of illness with improving symptoms and you have had 24 hours of no fever (without the use of Tylenol (Acetaminophen) Motrin (Ibuprofen) or any fever reducing medication).  Also - Do not get tested prior to returning to work because once you have had a positive test the test can stay  positive for more than a month in some cases.   You should wear a mask or cloth face covering over your nose and mouth if you must be around other people or animals, including pets (even at home). Try to stay at least 6 feet away from other people. This will protect the people around you.  Please continue good preventive care measures, including:  frequent hand-washing, avoid touching your face, cover coughs/sneezes, stay out of crowds and keep a 6 foot distance from others.  COVID-19 is a respiratory illness with symptoms that are similar to the flu. Symptoms are typically mild to moderate, but there have been cases of severe illness and death due to the virus.   The following symptoms may appear 2-14 days after exposure: . Fever . Cough . Shortness of breath or difficulty breathing . Chills . Repeated shaking with chills . Muscle pain . Headache . Sore throat . New loss of taste or smell . Fatigue . Congestion or runny nose . Nausea or vomiting . Diarrhea  Go to the nearest hospital ED for assessment if fever/cough/breathlessness are severe or illness seems like a threat to life.  It is vitally important that if you feel that you have an infection such as this virus or any other virus that you stay home and away from places where you may spread it to others.  You should avoid contact with people age 75 and older.   You can use medication such as A prescription cough medication called Tessalon Perles 100 mg. You may take 1-2 capsules every 8 hours as needed for cough and I have prescribed Fluticasone nasal spray 2 sprays in each nostril one time per day  You may also take acetaminophen (Tylenol) as needed for fever.  I have also provided a work note.  Reduce your risk of any infection by using the same precautions used for avoiding the common cold or  flu:  . Wash your hands often with soap and warm water for at least 20 seconds.  If soap and water are not readily available, use an  alcohol-based hand sanitizer with at least 60% alcohol.  . If coughing or sneezing, cover your mouth and nose by coughing or sneezing into the elbow areas of your shirt or coat, into a tissue or into your sleeve (not your hands). . Avoid shaking hands with others and consider head nods or verbal greetings only. . Avoid touching your eyes, nose, or mouth with unwashed hands.  . Avoid close contact with people who are sick. . Avoid places or events with large numbers of people in one location, like concerts or sporting events. . Carefully consider travel plans you have or are making. . If you are planning any travel outside or inside the [place], visit the CDC's Travelers' Health webpage for the latest health notices. . If you have some symptoms but not all symptoms, continue to monitor at home and seek medical attention if your symptoms worsen. . If you are having a medical emergency, call 911.  HOME CARE . Only take medications as instructed by your medical team. . Drink plenty of fluids and get plenty of rest. . A steam or ultrasonic humidifier can help if you have congestion.   GET HELP RIGHT AWAY IF YOU HAVE EMERGENCY WARNING SIGNS** FOR COVID-19. If you or someone is showing any of these signs seek emergency medical care immediately. Call 911 or proceed to your closest emergency facility if: . You develop worsening high fever. . Trouble breathing . Bluish lips or face . Persistent pain or pressure in the chest . New confusion . Inability to wake or stay awake . You cough up blood. . Your symptoms become more severe  **This list is not all possible symptoms. Contact your medical provider for any symptoms that are sever or concerning to you.  MAKE SURE YOU   Understand these instructions.  Will watch your condition.  Will get help right away if you are not doing well or get worse.  Your e-visit answers were reviewed by a board certified advanced clinical practitioner to complete your  personal care plan.  Depending on the condition, your plan could have included both over the counter or prescription medications.  If there is a problem please reply once you have received a response from your provider.  Your safety is important to [place].  If you have drug allergies check your prescription carefully.    You can use MyChart to ask questions about today's visit, request a non-urgent call back, or ask for a work or school excuse for 24 hours related to this e-Visit. If it has been greater than 24 hours you will need to follow up with your provider, or enter a new e-Visit to address those concerns. You will get an e-mail in the next two days asking about your experience.  I hope that your e-visit has been valuable and will speed your recovery. Thank you for using e-visits.   I spent 5-10 minutes on review and completion of this note- [facility]

## 2020-08-05 ENCOUNTER — Encounter: Payer: Self-pay | Admitting: Family Medicine

## 2020-08-05 ENCOUNTER — Other Ambulatory Visit: Payer: Self-pay | Admitting: Family Medicine

## 2020-08-05 ENCOUNTER — Other Ambulatory Visit: Payer: Self-pay

## 2020-08-05 ENCOUNTER — Telehealth: Payer: Self-pay | Admitting: *Deleted

## 2020-08-05 ENCOUNTER — Telehealth (INDEPENDENT_AMBULATORY_CARE_PROVIDER_SITE_OTHER): Payer: 59 | Admitting: Family Medicine

## 2020-08-05 DIAGNOSIS — J019 Acute sinusitis, unspecified: Secondary | ICD-10-CM

## 2020-08-05 MED ORDER — AMOXICILLIN-POT CLAVULANATE 875-125 MG PO TABS: 1.0000 | ORAL_TABLET | Freq: Two times a day (BID) | ORAL | 0 refills | Status: DC

## 2020-08-05 MED FILL — BENZONATATE 100 MG CAPS: 100 | 7 days supply | Qty: 20 | Fill #0

## 2020-08-05 MED FILL — FLUTICASONE PROP 50 MCG SPR: 50 | 30 days supply | Qty: 16 | Fill #0

## 2020-08-05 MED FILL — AMOX-CLAV 875-125 MG TABLET: 875-125 | 10 days supply | Qty: 20 | Fill #0

## 2020-08-05 NOTE — Progress Notes (Signed)
Patient ID: Erin Morris, female    DOB: 1985-10-02, 33 y.o.   MRN: [id number]   Chief Complaint  Patient presents with  . Cough    congestion and sinus pressure for several days- Covid test negative   Subjective:  CC: sinus pain and pressure and now congestion  Presents today for telephone visit with a complaint of sinus pain and pressure and congestion which started last Wednesday or Thursday.  She did have a low-grade fever last week none today associated symptoms include headache, congestion, cough, sore throat her ears feel full.  She does endorse maxillary and frontal sinus pain and pressure.  Pertinent negatives include no abdominal pain, no nausea, no vomiting, no diarrhea no chest pain no shortness of breath.  [facility]  She was prescribed Flonase and cough medicine.  She has not picked up that prescription yet.    Medical History Erin Morris has a past medical history of Angio-edema, Coronary atherosclerosis of native coronary artery, Headache, HPV in female, NSTEMI (non-ST elevated myocardial infarction) ([place]), Urticaria, and Vaginal Pap smear, abnormal.   Outpatient Encounter Medications as of 08/05/2020  Medication Sig  . amoxicillin-clavulanate (AUGMENTIN) 875-125 MG tablet Take 1 tablet by mouth 2 (two) times daily.  Marland Kitchen aspirin 81 MG EC tablet Take 81 mg by mouth daily.    . benzonatate (TESSALON) 100 MG capsule Take 1 capsule (100 mg total) by mouth 3 (three) times daily as needed.  . fluticasone (FLONASE) 50 MCG/ACT nasal spray Place 2 sprays into both nostrils daily.  Marland Kitchen loratadine (CLARITIN) 10 MG tablet Take 10 mg by mouth daily.  . nitroGLYCERIN (NITROSTAT) 0.4 MG SL tablet Place 1 tablet (0.4 mg total) under the tongue every 5 (five) minutes as needed for chest pain.  . paragard intrauterine copper IUD IUD ParaGard T 380A 380 square mm intrauterine device  Take 1 device by intrauterine route.  . simvastatin (ZOCOR) 5 MG  tablet TAKE 1 TABLET (5 MG TOTAL) BY MOUTH EVERY OTHER DAY.   No facility-administered encounter medications on file as of 08/05/2020.     Review of Systems  Constitutional: Positive for fever.       Low-grade  HENT: Positive for congestion, sinus pressure, sinus pain and sore throat.   Respiratory: Negative for shortness of breath.   Cardiovascular: Negative for chest pain.  Gastrointestinal: Negative for abdominal pain, diarrhea, nausea and vomiting.  Hematological: Negative for adenopathy.     Vitals There were no vitals taken for this visit. unable Objective:   Physical Exam Unable: virtual  Assessment and Plan   1. Acute rhinosinusitis - amoxicillin-clavulanate (AUGMENTIN) 875-125 MG tablet; Take 1 tablet by mouth 2 (two) times daily.  Dispense: 20 tablet; Refill: 0   Due to her significant maxillary and frontal sinus pain and pressure, her ears feeling full, and her headache, will treat for acute rhinosinusitis.  She endorses that she gets a sinus infection this time every year.  She will pick up the Flonase and the cough medication to treat her symptoms as well.  Agrees with plan of care discussed today. Understands warning signs to seek further care: Chest pain, shortness of breath, any significant changes in health status. Understands to follow-up if symptoms do not improve, or worsen.  Virtual Visit via Video Note  I connected with Erin Morris on 08/05/20 at  3:50 PM EST by a video enabled telemedicine application and verified that I am speaking with the  correct person using two identifiers.  Location: Patient: home Provider: office   I discussed the limitations of evaluation and management by telemedicine and the availability of in person appointments. The patient expressed understanding and agreed to proceed.  History of Present Illness:    Observations/Objective:   Assessment and Plan:   Follow Up Instructions:    I discussed the  assessment and treatment plan with the patient. The patient was provided an opportunity to ask questions and all were answered. The patient agreed with the plan and demonstrated an understanding of the instructions.   The patient was advised to call back or seek an in-person evaluation if the symptoms worsen or if the condition fails to improve as anticipated.  I provide 9 minutes of non-face-to-face time during this encounter.   Pecolia Ades, FNP-C

## 2020-08-05 NOTE — Telephone Encounter (Signed)
[name], chancellor are scheduled for a virtual visit with your provider today.    Just as we do with appointments in the office, we must obtain your consent to participate.  Your consent will be active for this visit and any virtual visit you may have with one of our providers in the next 365 days.    If you have a MyChart account, I can also send a copy of this consent to you electronically.  All virtual visits are billed to your insurance company just like a traditional visit in the office.  As this is a virtual visit, video technology does not allow for your provider to perform a traditional examination.  This may limit your provider's ability to fully assess your condition.  If your provider identifies any concerns that need to be evaluated in person or the need to arrange testing such as labs, EKG, etc, we will make arrangements to do so.    Although advances in technology are sophisticated, we cannot ensure that it will always work on either your end or our end.  If the connection with a video visit is poor, we may have to switch to a telephone visit.  With either a video or telephone visit, we are not always able to ensure that we have a secure connection.   I need to obtain your verbal consent now.   Are you willing to proceed with your visit today?   Erin Morris has provided verbal consent on 08/05/2020 for a virtual visit (video or telephone).

## 2020-08-12 ENCOUNTER — Ambulatory Visit: Payer: 59 | Admitting: Orthopedic Surgery

## 2020-08-19 ENCOUNTER — Ambulatory Visit: Payer: 59 | Admitting: Orthopedic Surgery

## 2021-01-02 ENCOUNTER — Other Ambulatory Visit (HOSPITAL_COMMUNITY): Payer: Self-pay

## 2021-01-02 MED FILL — Simvastatin Tab 5 MG: ORAL | 90 days supply | Qty: 45 | Fill #0 | Status: AC

## 2021-02-10 DIAGNOSIS — Z6831 Body mass index (BMI) 31.0-31.9, adult: Secondary | ICD-10-CM | POA: Diagnosis not present

## 2021-02-10 DIAGNOSIS — Z87410 Personal history of cervical dysplasia: Secondary | ICD-10-CM | POA: Diagnosis not present

## 2021-02-10 DIAGNOSIS — Z01419 Encounter for gynecological examination (general) (routine) without abnormal findings: Secondary | ICD-10-CM | POA: Diagnosis not present

## 2021-02-10 DIAGNOSIS — N926 Irregular menstruation, unspecified: Secondary | ICD-10-CM | POA: Diagnosis not present

## 2021-02-10 DIAGNOSIS — I252 Old myocardial infarction: Secondary | ICD-10-CM | POA: Diagnosis not present

## 2021-03-20 DIAGNOSIS — N926 Irregular menstruation, unspecified: Secondary | ICD-10-CM | POA: Diagnosis not present

## 2021-04-21 ENCOUNTER — Other Ambulatory Visit: Payer: Self-pay | Admitting: Cardiology

## 2021-04-22 ENCOUNTER — Other Ambulatory Visit (HOSPITAL_COMMUNITY): Payer: Self-pay

## 2021-04-22 MED ORDER — SIMVASTATIN 5 MG PO TABS
5.0000 mg | ORAL_TABLET | ORAL | 3 refills | Status: DC
  Filled 2021-04-22: qty 45, 90d supply, fill #0
  Filled 2021-07-27: qty 45, 90d supply, fill #1
  Filled 2022-04-06: qty 35, 70d supply, fill #2
  Filled 2022-04-06: qty 10, 20d supply, fill #2

## 2021-06-09 ENCOUNTER — Other Ambulatory Visit: Payer: Self-pay | Admitting: Cardiology

## 2021-06-09 ENCOUNTER — Other Ambulatory Visit (HOSPITAL_COMMUNITY): Payer: Self-pay

## 2021-06-09 MED ORDER — NITROGLYCERIN 0.4 MG SL SUBL
0.4000 mg | SUBLINGUAL_TABLET | SUBLINGUAL | 3 refills | Status: DC | PRN
  Filled 2021-06-09: qty 25, 5d supply, fill #0

## 2021-07-28 ENCOUNTER — Other Ambulatory Visit (HOSPITAL_COMMUNITY): Payer: Self-pay

## 2021-08-29 ENCOUNTER — Other Ambulatory Visit: Payer: Self-pay

## 2021-08-29 ENCOUNTER — Other Ambulatory Visit (HOSPITAL_COMMUNITY): Payer: Self-pay

## 2021-08-29 ENCOUNTER — Ambulatory Visit: Payer: 59 | Admitting: Family Medicine

## 2021-08-29 VITALS — BP 130/78 | HR 68 | Temp 97.8°F | Wt 207.8 lb

## 2021-08-29 DIAGNOSIS — J0100 Acute maxillary sinusitis, unspecified: Secondary | ICD-10-CM | POA: Diagnosis not present

## 2021-08-29 MED ORDER — AMOXICILLIN-POT CLAVULANATE 875-125 MG PO TABS
1.0000 | ORAL_TABLET | Freq: Two times a day (BID) | ORAL | 0 refills | Status: DC
  Filled 2021-08-29: qty 20, 10d supply, fill #0

## 2021-08-29 MED ORDER — FLUCONAZOLE 150 MG PO TABS
150.0000 mg | ORAL_TABLET | Freq: Once | ORAL | 0 refills | Status: AC
  Filled 2021-08-29: qty 2, 2d supply, fill #0

## 2021-08-30 DIAGNOSIS — J0100 Acute maxillary sinusitis, unspecified: Secondary | ICD-10-CM | POA: Insufficient documentation

## 2021-08-30 NOTE — Progress Notes (Signed)
Subjective:  Patient ID: Erin Morris, female    DOB: 02-Oct-1985  Age: 35 y.o. MRN: [id number]  CC: Chief Complaint  Patient presents with   sinus pressure    Also has congestion and runny nose, all this started earlier in the week. Seems like it's going into chest. Cough started this morning. Temp running around 99.5     HPI:  35 year old female presents with the above complaints.  Patient reports that her symptoms started on Monday.  She has had sinus pressure and congestion.  She is also had a mild cough.  No fever.  Has a history of sinusitis.  Low-grade temp.  No relieving factors.  Works in [employer].  No other complaints or concerns at this time.  Patient Active Problem List   Diagnosis Date Noted   Acute maxillary sinusitis 08/30/2021   Mixed hyperlipidemia 05/14/2009   Coronary artery disease involving native coronary artery of native heart without angina pectoris 04/16/2009    Social Hx   Social History   Socioeconomic History   Marital status: Married    Spouse name: Not on file   Number of children: Not on file   Years of education: Not on file   Highest education level: Not on file  Occupational History   Occupation: radiology tech  Tobacco Use   Smoking status: Never   Smokeless tobacco: Never  Vaping Use   Vaping Use: Never used  Substance and Sexual Activity   Alcohol use: Not Currently    Alcohol/week: 0.0 standard drinks    Comment: maybe once a month   Drug use: No   Sexual activity: Yes    Birth control/protection: I.U.D.  Other Topics Concern   Not on file  Social History Narrative   Single   Lives with her boyfriend   No pregnancy   Social Determinants of Health   Financial Resource Strain: Not on file  Food Insecurity: Not on file  Transportation Needs: Not on file  Physical Activity: Not on file  Stress: Not on file  Social Connections: Not on file    Review of Systems Per HPI  Objective:  BP 130/78   Pulse 68    Temp 97.8 F (36.6 C) (Oral)   Wt 207 lb 12.8 oz (94.3 kg)   SpO2 100%   BMI 32.55 kg/m   BP/Weight 08/29/2021 70/26/3785 04/28/5026  Systolic BP 741 287 867  Diastolic BP 78 72 76  Wt. (Lbs) 207.8 206.4 -  BMI 32.55 32.33 -  Some encounter information is confidential and restricted. Go to Review Flowsheets activity to see all data.    Physical Exam Constitutional:      General: She is not in acute distress.    Appearance: Normal appearance. She is not ill-appearing.  HENT:     Head: Normocephalic and atraumatic.     Nose:     Comments: Tenderness over the maxillary sinuses. Eyes:     General:        Right eye: No discharge.        Left eye: No discharge.     Conjunctiva/sclera: Conjunctivae normal.  Cardiovascular:     Rate and Rhythm: Normal rate and regular rhythm.     Heart sounds: No murmur heard. Pulmonary:     Effort: Pulmonary effort is normal.     Breath sounds: Normal breath sounds. No wheezing or rales.  Neurological:     Mental Status: She is alert.  Psychiatric:  Mood and Affect: Mood normal.        Behavior: Behavior normal.    Lab Results  Component Value Date   WBC 15.3 (H) 11/26/2018   HGB 9.8 (L) 11/26/2018   HCT 30.1 (L) 11/26/2018   PLT 162 11/26/2018   GLUCOSE 90 03/21/2019   CHOL 103 08/01/2020   TRIG 28 08/01/2020   HDL 51 08/01/2020   LDLCALC 46 08/01/2020   ALT 32 03/21/2019   AST 22 03/21/2019   NA 143 03/21/2019   K 4.9 03/21/2019   CL 103 03/21/2019   CREATININE 0.62 03/21/2019   BUN 15 03/21/2019   CO2 24 03/21/2019   TSH 3.760 03/21/2019   INR 1.04 12/15/2013     Assessment & Plan:   Problem List Items Addressed This Visit       Respiratory   Acute maxillary sinusitis - Primary    History and physical exam consistent with maxillary sinusitis.  Treated with Augmentin.  Diflucan if needed.      Relevant Medications   amoxicillin-clavulanate (AUGMENTIN) 875-125 MG tablet    Meds ordered this encounter   Medications   amoxicillin-clavulanate (AUGMENTIN) 875-125 MG tablet    Sig: Take 1 tablet by mouth 2 (two) times daily.    Dispense:  20 tablet    Refill:  0   fluconazole (DIFLUCAN) 150 MG tablet    Sig: Take 1 tablet (150 mg total) by mouth once for 1 dose. Repeat dose in 72 hours.    Dispense:  2 tablet    Refill:  0    Follow-up:  PRN  Mazie

## 2021-08-30 NOTE — Assessment & Plan Note (Signed)
History and physical exam consistent with maxillary sinusitis.  Treated with Augmentin.  Diflucan if needed.

## 2021-10-08 ENCOUNTER — Ambulatory Visit: Payer: 59 | Admitting: Cardiology

## 2021-10-08 ENCOUNTER — Encounter: Payer: Self-pay | Admitting: Cardiology

## 2021-10-08 VITALS — BP 120/82 | HR 82 | Ht 67.0 in | Wt 209.0 lb

## 2021-10-08 DIAGNOSIS — I2510 Atherosclerotic heart disease of native coronary artery without angina pectoris: Secondary | ICD-10-CM | POA: Diagnosis not present

## 2021-10-08 NOTE — Patient Instructions (Addendum)

## 2021-10-08 NOTE — Progress Notes (Signed)
Cardiology Office Note  Date: 10/08/2021   ID: Erin Morris, DOB 1985/12/17, MRN [id number]  PCP:  [name], DO  Cardiologist:  [name], MD Electrophysiologist:  None   Chief Complaint  Patient presents with   Cardiac follow-up    History of Present Illness: Erin Morris is a 36 y.o. female last seen in October 2021.  She is here for a routine visit.  Overall doing very well, no angina symptoms, NYHA class I dyspnea.  She exercises 3 to 4 days a week at the gym, uses weights and also aerobic activity without limitation.  I reviewed her medications which are stable and outlined below.  ECG today shows normal sinus rhythm.  She remains on low-dose Zocor and her last LDL was 46.  She is now following with Dr. Lacinda Axon for primary care.  She is contemplating a second pregnancy, did very well with her first.  She has a pending consultation with her obstetrician.  She does not have a cardiac specific restriction to avoid another pregnancy at this time.  She did hold Zocor for the last pregnancy.  Past Medical History:  Diagnosis Date   Angio-edema    Coronary atherosclerosis of native coronary artery    BMS to mid circumflex 2010   Headache    HPV in female    NSTEMI (non-ST elevated myocardial infarction) ([place])    2010 - had prior workup for hypercoagulable state that was negative   Urticaria    Vaginal Pap smear, abnormal     Past Surgical History:  Procedure Laterality Date   addnoids     ADENOIDECTOMY     COLPOSCOPY     LEFT HEART CATHETERIZATION WITH CORONARY ANGIOGRAM N/A 12/15/2013   Procedure: LEFT HEART CATHETERIZATION WITH CORONARY ANGIOGRAM;  Surgeon: Troy Sine, MD;  Location: [facility] CATH LAB;  Service: Cardiovascular;  Laterality: N/A;   SHOULDER SURGERY Left    TONSILLECTOMY     WISDOM TOOTH EXTRACTION      Current Outpatient Medications  Medication Sig Dispense Refill   aspirin 81 MG EC tablet Take 81 mg by mouth daily.       loratadine  (CLARITIN) 10 MG tablet Take 10 mg by mouth daily.     nitroGLYCERIN (NITROSTAT) 0.4 MG SL tablet Place 1 tablet (0.4 mg total) under the tongue every 5 (five) minutes as needed for chest pain. 25 tablet 3   paragard intrauterine copper IUD IUD ParaGard T 380A 380 square mm intrauterine device  Take 1 device by intrauterine route.     simvastatin (ZOCOR) 5 MG tablet Take 1 tablet (5 mg total) by mouth every other day. 45 tablet 3   No current facility-administered medications for this visit.   Allergies:  Nsaids   ROS: No palpitations or syncope.  Physical Exam: VS:  BP 120/82    Pulse 82    Ht 5\' 7"  (1.702 m)    Wt 209 lb (94.8 kg)    SpO2 96%    BMI 32.73 kg/m , BMI Body mass index is 32.73 kg/m.  Wt Readings from Last 3 Encounters:  10/08/21 209 lb (94.8 kg)  08/29/21 207 lb 12.8 oz (94.3 kg)  07/01/20 206 lb 6.4 oz (93.6 kg)    General: Patient appears comfortable at rest. HEENT: Conjunctiva and lids normal, wearing a mask. Neck: Supple, no elevated JVP or carotid bruits. Lungs: Clear to auscultation, nonlabored breathing at rest. Cardiac: Regular rate and rhythm, no S3 or significant  systolic murmur, no pericardial rub. Extremities: No pitting edema.  ECG:  An ECG dated 07/01/2020 was personally reviewed today and demonstrated:  Sinus rhythm.  Recent Labwork:    Component Value Date/Time   CHOL 103 08/01/2020 0821   CHOL 120 03/21/2019 0842   TRIG 28 08/01/2020 0821   HDL 51 08/01/2020 0821   HDL 55 03/21/2019 0842   CHOLHDL 2.0 08/01/2020 0821   VLDL 6 08/01/2020 0821   LDLCALC 46 08/01/2020 0821   LDLCALC 54 03/21/2019 0842    Other Studies Reviewed Today:  No interval cardiac testing for review today.  Assessment and Plan:  CAD with history of BMS to the circumflex in 2010 with follow-up angiography in 2015 showing patent stent site.  She continues to do very well without active angina symptoms.  ECG is normal today.  She exercises 3 to 4 days a week without  limitation.  For now we will continue aspirin and low-dose Zocor.  She can have follow-up lipid panel with PCP at routine physical later this year.  There is no specific cardiac contraindication for her to consider a second pregnancy as discussed above.  She plans to review this with her obstetrician at upcoming consultation.  We will continue with routine follow-up over time.  Medication Adjustments/Labs and Tests Ordered: Current medicines are reviewed at length with the patient today.  Concerns regarding medicines are outlined above.   Tests Ordered: Orders Placed This Encounter  Procedures   EKG 12-Lead    Medication Changes: No orders of the defined types were placed in this encounter.   Disposition:  Follow up  1 year.  Signed, Satira Sark, MD, [facility] 10/08/2021 11:46 AM    [place] [facility] [place], [place],  [address] Phone: [phone]; Fax: [phone]

## 2021-10-16 DIAGNOSIS — Z309 Encounter for contraceptive management, unspecified: Secondary | ICD-10-CM | POA: Diagnosis not present

## 2021-10-16 DIAGNOSIS — Z30432 Encounter for removal of intrauterine contraceptive device: Secondary | ICD-10-CM | POA: Diagnosis not present

## 2021-10-16 DIAGNOSIS — Z319 Encounter for procreative management, unspecified: Secondary | ICD-10-CM | POA: Diagnosis not present

## 2021-12-19 ENCOUNTER — Other Ambulatory Visit: Payer: Self-pay | Admitting: Nurse Practitioner

## 2021-12-19 ENCOUNTER — Telehealth: Payer: Self-pay | Admitting: Nurse Practitioner

## 2021-12-19 MED ORDER — OFLOXACIN 0.3 % OT SOLN: OTIC | 0 refills | Status: DC

## 2021-12-19 NOTE — Telephone Encounter (Signed)
Pt daughter just diagnosed with pink eye and mom is wanting to know if they can get a preventative script sent in for them so they do not miss work on Monday. Please advise.  ? ?[facility] [address] ?

## 2021-12-26 DIAGNOSIS — H5213 Myopia, bilateral: Secondary | ICD-10-CM | POA: Diagnosis not present

## 2022-03-27 DIAGNOSIS — N911 Secondary amenorrhea: Secondary | ICD-10-CM | POA: Diagnosis not present

## 2022-04-01 ENCOUNTER — Other Ambulatory Visit (HOSPITAL_COMMUNITY): Payer: Self-pay

## 2022-04-01 DIAGNOSIS — O021 Missed abortion: Secondary | ICD-10-CM | POA: Diagnosis not present

## 2022-04-01 DIAGNOSIS — N911 Secondary amenorrhea: Secondary | ICD-10-CM | POA: Diagnosis not present

## 2022-04-01 MED ORDER — MISOPROSTOL 200 MCG PO TABS
800.0000 ug | ORAL_TABLET | ORAL | 1 refills | Status: DC
  Filled 2022-04-01: qty 8, 2d supply, fill #0

## 2022-04-06 ENCOUNTER — Other Ambulatory Visit (HOSPITAL_COMMUNITY): Payer: Self-pay

## 2022-04-08 ENCOUNTER — Telehealth: Payer: Self-pay

## 2022-04-08 ENCOUNTER — Telehealth: Payer: Self-pay | Admitting: *Deleted

## 2022-04-08 DIAGNOSIS — O021 Missed abortion: Secondary | ICD-10-CM | POA: Diagnosis not present

## 2022-04-08 NOTE — Telephone Encounter (Signed)
I s/w the pt and she tells me her surgery has been moved to 04/20/22. Pt has been scheduled for a  tele visit 04/15/22 @ 2 pm. Med rec and consent are done. Pt thanked me for the help today.      Patient Consent for Virtual Visit        Erin Morris has provided verbal consent on 04/08/2022 for a virtual visit (video or telephone).   CONSENT FOR VIRTUAL VISIT FOR:  Erin Morris  By participating in this virtual visit I agree to the following:  I hereby voluntarily request, consent and authorize [facility] and its employed or contracted physicians, physician assistants, nurse practitioners or other licensed health care professionals (the Practitioner), to provide me with telemedicine health care services (the "Services") as deemed necessary by the treating Practitioner. I acknowledge and consent to receive the Services by the Practitioner via telemedicine. I understand that the telemedicine visit will involve communicating with the Practitioner through live audiovisual communication technology and the disclosure of certain medical information by electronic transmission. I acknowledge that I have been given the opportunity to request an in-person assessment or other available alternative prior to the telemedicine visit and am voluntarily participating in the telemedicine visit.  I understand that I have the right to withhold or withdraw my consent to the use of telemedicine in the course of my care at any time, without affecting my right to future care or treatment, and that the Practitioner or I may terminate the telemedicine visit at any time. I understand that I have the right to inspect all information obtained and/or recorded in the course of the telemedicine visit and may receive copies of available information for a reasonable fee.  I understand that some of the potential risks of receiving the Services via telemedicine include:  Delay or interruption in medical evaluation due to  technological equipment failure or disruption; Information transmitted may not be sufficient (e.g. poor resolution of images) to allow for appropriate medical decision making by the Practitioner; and/or  In rare instances, security protocols could fail, causing a breach of personal health information.  Furthermore, I acknowledge that it is my responsibility to provide information about my medical history, conditions and care that is complete and accurate to the best of my ability. I acknowledge that Practitioner's advice, recommendations, and/or decision may be based on factors not within their control, such as incomplete or inaccurate data provided by me or distortions of diagnostic images or specimens that may result from electronic transmissions. I understand that the practice of medicine is not an exact science and that Practitioner makes no warranties or guarantees regarding treatment outcomes. I acknowledge that a copy of this consent can be made available to me via my patient portal ([facility]), or I can request a printed copy by calling the office of [facility].    I understand that my insurance will be billed for this visit.   I have read or had this consent read to me. I understand the contents of this consent, which adequately explains the benefits and risks of the Services being provided via telemedicine.  I have been provided ample opportunity to ask questions regarding this consent and the Services and have had my questions answered to my satisfaction. I give my informed consent for the services to be provided through the use of telemedicine in my medical care

## 2022-04-08 NOTE — Telephone Encounter (Signed)
   Name: Erin Morris  DOB: 04-Mar-1986  MRN: [id number]  Primary Cardiologist: [name], MD   Preoperative team, please contact this patient and set up a phone call appointment for further preoperative risk assessment. Please obtain consent and complete medication review. Thank you for your help.  I confirm that guidance regarding antiplatelet and oral anticoagulation therapy has been completed and, if necessary, noted below.   Lenna Sciara, NP 04/08/2022, 3:33 PM [facility] [address] [place], [place] [address]

## 2022-04-08 NOTE — Telephone Encounter (Signed)
   Pre-operative Risk Assessment    Patient Name: Erin Morris  DOB: 04/08/1986 MRN: [id number]      Request for Surgical Clearance    Procedure:   Hyst D&C, possible myosure, Lap BTL with  Filsche Clips  Date of Surgery:  Clearance 04/09/22                                 Surgeon:  Dr. Everlene Farrier Surgeon's Group or Practice Name:  [facility] Phone number:  [phone] Fax number:  [phone]   Type of Clearance Requested:   - Medical    Type of Anesthesia:  Not Indicated   Additional requests/questions:    SignedSung Amabile   04/08/2022, 3:14 PM

## 2022-04-08 NOTE — Telephone Encounter (Signed)
I s/w the pt and she tells me her surgery has been moved to 04/20/22. Pt has been scheduled for a  tele visit 04/15/22 @ 2 pm. Med rec and consent are done. Pt thanked me for the help today.

## 2022-04-09 ENCOUNTER — Encounter (HOSPITAL_BASED_OUTPATIENT_CLINIC_OR_DEPARTMENT_OTHER): Payer: Self-pay | Admitting: Obstetrics and Gynecology

## 2022-04-09 ENCOUNTER — Other Ambulatory Visit: Payer: Self-pay

## 2022-04-09 NOTE — Progress Notes (Signed)
04/09/2022 3:24 PM Spoke w/ via phone for pre-op interview---Patient Lab needs dos----IStat Chem 8 Lab results------EKG done 10/08/21 in EPIC COVID test -----patient states asymptomatic no test needed Arrive at -------0945 NPO after MN NO Solid Food.  Clear liquids from MN until---MN Med rec completed Medications to take morning of surgery -----NONE Diabetic medication -----NA Patient instructed no nail polish to be worn day of surgery Patient instructed to bring photo id and insurance card day of surgery Patient aware to have Driver (ride ) / caregiver    for 24 hours after surgery -HUSBAND Patient Special Instructions ----Advised to follow cardiology recommendations for ASA 81 mg prior to surgery due to hx NSTEMI with Stents in 2010. Pt. Has cardiac clearance apt. Scheduled 04/15/22. Pre-Op special Istructions -----n/a Patient verbalized understanding of instructions that were given at this phone interview. Patient denies shortness of breath, chest pain, fever, cough at this phone interview.  Ashlyne Olenick, [place]ime

## 2022-04-15 ENCOUNTER — Ambulatory Visit (INDEPENDENT_AMBULATORY_CARE_PROVIDER_SITE_OTHER): Payer: 59 | Admitting: Physician Assistant

## 2022-04-15 DIAGNOSIS — Z01810 Encounter for preprocedural cardiovascular examination: Secondary | ICD-10-CM | POA: Diagnosis not present

## 2022-04-15 NOTE — Progress Notes (Signed)
Virtual Visit via Telephone Note   Because of Erin Morris co-morbid illnesses, she is at least at moderate risk for complications without adequate follow up.  This format is felt to be most appropriate for this patient at this time.  The patient did not have access to video technology/had technical difficulties with video requiring transitioning to audio format only (telephone).  All issues noted in this document were discussed and addressed.  No physical exam could be performed with this format.  Please refer to the patient's chart for her consent to telehealth for [facility].  Evaluation Performed:  Preoperative cardiovascular risk assessment _____________   Date:  04/15/2022   Patient ID:  Erin Morris, DOB [birth year], MRN [id number] Patient Location:  Home Provider location:   Office  Primary Care Provider:  [name], DO Primary Cardiologist:  [name], MD  Chief Complaint / Patient Profile   36 y.o. y/o female with a h/o CAD s/p BMS to mid left Circumflex artery in 2010 (patent stent on relook cath 2015) who is pending GYN surgery to include hysteroscopy with dilatation and curettage and possible MyoSure and presents today for telephonic preoperative cardiovascular risk assessment.  Past Medical History    Past Medical History:  Diagnosis Date   Angio-edema    Coronary atherosclerosis of native coronary artery    BMS to mid circumflex 2010   Headache    HPV in female    NSTEMI (non-ST elevated myocardial infarction) ([place])    2010 - had prior workup for hypercoagulable state that was negative   PONV (postoperative nausea and vomiting)    Urticaria    Vaginal Pap smear, abnormal    Past Surgical History:  Procedure Laterality Date   addnoids     ADENOIDECTOMY     toddler   COLPOSCOPY     LEFT HEART CATHETERIZATION WITH CORONARY ANGIOGRAM N/A 12/15/2013   Procedure: LEFT HEART CATHETERIZATION WITH CORONARY ANGIOGRAM;  Surgeon: Troy Sine, MD;  Location: [facility] CATH LAB;  Service: Cardiovascular;  Laterality: N/A;   SHOULDER SURGERY Left 2008   TONSILLECTOMY     WISDOM TOOTH EXTRACTION     36yr old    Allergies  Allergies  Allergen Reactions   Nsaids Other (See Comments)    stent    History of Present Illness    Erin Morris a 36y.o. female who presents via audio/video conferencing for a telehealth visit today.  Pt was last seen in cardiology clinic on 10/08/2021 by by Dr. MDomenic Polite  At that time Erin Morris doing well.  The patient is now pending procedure as outlined above. Since her last visit, she has been doing well and is able to achieve at least 4 METS of activity without any issue.  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  She stays very active.   Home Medications    Prior to Admission medications   Medication Sig Start Date End Date Taking? Authorizing Provider  aspirin 81 MG EC tablet Take 81 mg by mouth daily.      [provider]  loratadine (CLARITIN) 10 MG tablet Take 10 mg by mouth daily.    [provider]  misoprostol (CYTOTEC) 200 MCG tablet Place 4 tablets (800 mcg total) vaginally once for 1 dose. If no results in 24 hours repeat dose. 04/01/22     nitroGLYCERIN (NITROSTAT) 0.4 MG SL tablet  Place 1 tablet (0.4 mg total) under the tongue every 5 (five) minutes as needed for chest pain. 06/09/21   Satira Sark, MD  ofloxacin (FLOXIN) 0.3 % OTIC solution Place 1 gtt in affected eye(s) QID prn pink eye 12/19/21   Nilda Simmer, NP  paragard intrauterine copper IUD IUD ParaGard T 380A 380 square mm intrauterine device  Take 1 device by intrauterine route. 02/02/19   [provider]  simvastatin (ZOCOR) 5 MG tablet Take 1 tablet (5 mg total) by mouth every other day. 04/22/21   Satira Sark, MD    Physical Exam    Vital Signs:  Erin Morris does not have vital signs  available for review today.  Given telephonic nature of communication, physical exam is limited. AAOx3. NAD. Normal affect.  Speech and respirations are unlabored.  Accessory Clinical Findings    None  Assessment & Plan    1.  Preoperative Cardiovascular Risk Assessment:  -Patient with Dr. Domenic Polite pending upcoming GYN surgery include hysteroscopy with dilatation and curettage with possible MyoSure.  She denies any recent exertional chest pain or worsening shortness of breath.  Only cardiac history involve bare-metal stent to mid left circumflex artery in 2010.  Repeat low cardiac catheterization in 2015 showed patent stent without significant residual disease.  She is very active and able to achieve at least 4 METS of activity without any exertional symptoms.  She is at acceptable risk to proceed with GYN surgery without further work-up.  Ideally, if possible, we would prefer she continue on aspirin through the procedure.  However if the bleeding risk is too high, she may hold aspirin for 5 to 7 days prior to the procedure and restart as once possible afterward at the surgeon's discretion.   A copy of this note will be routed to requesting surgeon.  Time:   Today, I have spent 4 minutes with the patient with telehealth technology discussing medical history, symptoms, and management plan.     [place], [place]  04/15/2022, 2:01 PM

## 2022-04-20 ENCOUNTER — Ambulatory Visit (HOSPITAL_BASED_OUTPATIENT_CLINIC_OR_DEPARTMENT_OTHER): Payer: 59 | Admitting: Anesthesiology

## 2022-04-20 ENCOUNTER — Encounter (HOSPITAL_BASED_OUTPATIENT_CLINIC_OR_DEPARTMENT_OTHER)
Admission: RE | Disposition: A | Discharge status: Home / Self Care | Payer: Self-pay | Source: Home / Self Care | Attending: Obstetrics and Gynecology

## 2022-04-20 ENCOUNTER — Encounter (HOSPITAL_BASED_OUTPATIENT_CLINIC_OR_DEPARTMENT_OTHER): Payer: Self-pay | Admitting: Obstetrics and Gynecology

## 2022-04-20 ENCOUNTER — Ambulatory Visit (HOSPITAL_BASED_OUTPATIENT_CLINIC_OR_DEPARTMENT_OTHER)
Admission: RE | Admit: 2022-04-20 | Discharge: 2022-04-20 | Disposition: A | Discharge status: Home / Self Care | Payer: 59 | Attending: Obstetrics and Gynecology | Admitting: Obstetrics and Gynecology

## 2022-04-20 ENCOUNTER — Other Ambulatory Visit: Payer: Self-pay

## 2022-04-20 ENCOUNTER — Other Ambulatory Visit (HOSPITAL_COMMUNITY): Payer: Self-pay

## 2022-04-20 DIAGNOSIS — E669 Obesity, unspecified: Secondary | ICD-10-CM | POA: Insufficient documentation

## 2022-04-20 DIAGNOSIS — I252 Old myocardial infarction: Secondary | ICD-10-CM | POA: Diagnosis not present

## 2022-04-20 DIAGNOSIS — O034 Incomplete spontaneous abortion without complication: Secondary | ICD-10-CM

## 2022-04-20 DIAGNOSIS — Z302 Encounter for sterilization: Secondary | ICD-10-CM | POA: Diagnosis not present

## 2022-04-20 DIAGNOSIS — Z3A00 Weeks of gestation of pregnancy not specified: Secondary | ICD-10-CM | POA: Diagnosis not present

## 2022-04-20 DIAGNOSIS — I2510 Atherosclerotic heart disease of native coronary artery without angina pectoris: Secondary | ICD-10-CM | POA: Diagnosis not present

## 2022-04-20 DIAGNOSIS — O411290 Chorioamnionitis, unspecified trimester, not applicable or unspecified: Secondary | ICD-10-CM | POA: Diagnosis not present

## 2022-04-20 HISTORY — DX: Other specified postprocedural states: Z98.890

## 2022-04-20 HISTORY — PX: LAPAROSCOPIC TUBAL LIGATION: SHX1937

## 2022-04-20 HISTORY — PX: DILATION AND EVACUATION: SHX1459

## 2022-04-20 HISTORY — DX: Other specified postprocedural states: R11.2

## 2022-04-20 HISTORY — PX: DILATATION & CURETTAGE/HYSTEROSCOPY WITH MYOSURE: SHX6511

## 2022-04-20 LAB — POCT I-STAT, CHEM 8
BUN: 12 mg/dL (ref 6–20)
Calcium, Ion: 1.22 mmol/L (ref 1.15–1.40)
Chloride: 106 mmol/L (ref 98–111)
Creatinine, Ser: 0.5 mg/dL (ref 0.44–1.00)
Glucose, Bld: 95 mg/dL (ref 70–99)
HCT: 43 % (ref 36.0–46.0)
Hemoglobin: 14.6 g/dL (ref 12.0–15.0)
Potassium: 4.3 mmol/L (ref 3.5–5.1)
Sodium: 142 mmol/L (ref 135–145)
TCO2: 23 mmol/L (ref 22–32)

## 2022-04-20 LAB — TYPE AND SCREEN
ABO/RH(D): A POS
Antibody Screen: NEGATIVE

## 2022-04-20 LAB — CBC
HCT: 43.7 % (ref 36.0–46.0)
Hemoglobin: 14.6 g/dL (ref 12.0–15.0)
MCH: 32.1 pg (ref 26.0–34.0)
MCHC: 33.4 g/dL (ref 30.0–36.0)
MCV: 96 fL (ref 80.0–100.0)
Platelets: 152 10*3/uL (ref 150–400)
RBC: 4.55 MIL/uL (ref 3.87–5.11)
RDW: 12.4 % (ref 11.5–15.5)
WBC: 4.2 10*3/uL (ref 4.0–10.5)
nRBC: 0 % (ref 0.0–0.2)

## 2022-04-20 SURGERY — DILATATION & CURETTAGE/HYSTEROSCOPY WITH MYOSURE
Anesthesia: General | Site: Vagina

## 2022-04-20 MED ORDER — DEXMEDETOMIDINE HCL IN NACL 80 MCG/20ML IV SOLN
INTRAVENOUS | Status: AC
  Filled 2022-04-20: qty 20

## 2022-04-20 MED ORDER — PROPOFOL 10 MG/ML IV BOLUS
INTRAVENOUS | Status: DC | PRN
  Administered 2022-04-20: 150 mg via INTRAVENOUS

## 2022-04-20 MED ORDER — EPHEDRINE 5 MG/ML INJ
INTRAVENOUS | Status: AC
  Filled 2022-04-20: qty 5

## 2022-04-20 MED ORDER — FENTANYL CITRATE (PF) 100 MCG/2ML IJ SOLN
INTRAMUSCULAR | Status: AC
  Filled 2022-04-20: qty 2

## 2022-04-20 MED ORDER — SCOPOLAMINE 1 MG/3DAYS TD PT72
MEDICATED_PATCH | TRANSDERMAL | Status: AC
  Filled 2022-04-20: qty 1

## 2022-04-20 MED ORDER — SODIUM CHLORIDE 0.9 % IR SOLN
Status: DC | PRN
  Administered 2022-04-20: 3000 mL

## 2022-04-20 MED ORDER — LIDOCAINE HCL (PF) 2 % IJ SOLN
INTRAMUSCULAR | Status: AC
  Filled 2022-04-20: qty 15

## 2022-04-20 MED ORDER — SUGAMMADEX SODIUM 200 MG/2ML IV SOLN
INTRAVENOUS | Status: DC | PRN
  Administered 2022-04-20: 200 mg via INTRAVENOUS

## 2022-04-20 MED ORDER — ROCURONIUM BROMIDE 10 MG/ML (PF) SYRINGE
PREFILLED_SYRINGE | INTRAVENOUS | Status: DC | PRN
  Administered 2022-04-20: 100 mg via INTRAVENOUS

## 2022-04-20 MED ORDER — MIDAZOLAM HCL 2 MG/2ML IJ SOLN
INTRAMUSCULAR | Status: DC | PRN
  Administered 2022-04-20: 2 mg via INTRAVENOUS

## 2022-04-20 MED ORDER — METHYLERGONOVINE MALEATE 0.2 MG/ML IJ SOLN
INTRAMUSCULAR | Status: AC
  Filled 2022-04-20: qty 1

## 2022-04-20 MED ORDER — ONDANSETRON HCL 4 MG/2ML IJ SOLN
INTRAMUSCULAR | Status: AC
  Filled 2022-04-20: qty 2

## 2022-04-20 MED ORDER — PROPOFOL 10 MG/ML IV BOLUS
INTRAVENOUS | Status: AC
  Filled 2022-04-20: qty 20

## 2022-04-20 MED ORDER — ROCURONIUM BROMIDE 10 MG/ML (PF) SYRINGE
PREFILLED_SYRINGE | INTRAVENOUS | Status: AC
  Filled 2022-04-20: qty 10

## 2022-04-20 MED ORDER — EPHEDRINE SULFATE-NACL 50-0.9 MG/10ML-% IV SOSY
PREFILLED_SYRINGE | INTRAVENOUS | Status: DC | PRN
  Administered 2022-04-20: 10 mg via INTRAVENOUS

## 2022-04-20 MED ORDER — LIDOCAINE HCL 1 % IJ SOLN
INTRAMUSCULAR | Status: AC
  Filled 2022-04-20: qty 20

## 2022-04-20 MED ORDER — PROMETHAZINE HCL 25 MG/ML IJ SOLN: 6.2500 mg | INTRAMUSCULAR | Status: DC | PRN

## 2022-04-20 MED ORDER — MIDAZOLAM HCL 2 MG/2ML IJ SOLN
INTRAMUSCULAR | Status: AC
  Filled 2022-04-20: qty 2

## 2022-04-20 MED ORDER — DEXMEDETOMIDINE (PRECEDEX) IN NS 20 MCG/5ML (4 MCG/ML) IV SYRINGE
PREFILLED_SYRINGE | INTRAVENOUS | Status: DC | PRN
  Administered 2022-04-20 (×4): 4 ug via INTRAVENOUS

## 2022-04-20 MED ORDER — AMISULPRIDE (ANTIEMETIC) 5 MG/2ML IV SOLN: 10.0000 mg | Freq: Once | INTRAVENOUS | Status: DC | PRN

## 2022-04-20 MED ORDER — ACETAMINOPHEN ER 650 MG PO TBCR
650.0000 mg | EXTENDED_RELEASE_TABLET | Freq: Three times a day (TID) | ORAL | 0 refills | Status: DC | PRN
  Filled 2022-04-20: qty 30, 10d supply, fill #0

## 2022-04-20 MED ORDER — DEXAMETHASONE SODIUM PHOSPHATE 10 MG/ML IJ SOLN
INTRAMUSCULAR | Status: DC | PRN
  Administered 2022-04-20: 10 mg via INTRAVENOUS

## 2022-04-20 MED ORDER — HYDROMORPHONE HCL 1 MG/ML IJ SOLN: 0.2500 mg | INTRAMUSCULAR | Status: DC | PRN

## 2022-04-20 MED ORDER — DIPHENHYDRAMINE HCL 50 MG/ML IJ SOLN
INTRAMUSCULAR | Status: DC | PRN
  Administered 2022-04-20: 12.5 mg via INTRAVENOUS

## 2022-04-20 MED ORDER — ONDANSETRON HCL 4 MG/2ML IJ SOLN
INTRAMUSCULAR | Status: DC | PRN
  Administered 2022-04-20: 4 mg via INTRAVENOUS

## 2022-04-20 MED ORDER — SUGAMMADEX SODIUM 500 MG/5ML IV SOLN
INTRAVENOUS | Status: AC
  Filled 2022-04-20: qty 5

## 2022-04-20 MED ORDER — LACTATED RINGERS IV SOLN
INTRAVENOUS | Status: DC
Start: 2022-04-20 — End: 2022-04-20

## 2022-04-20 MED ORDER — CEFAZOLIN SODIUM-DEXTROSE 2-4 GM/100ML-% IV SOLN
2.0000 g | INTRAVENOUS | Status: AC
Start: 2022-04-20 — End: 2022-04-20
  Administered 2022-04-20: 2 g via INTRAVENOUS

## 2022-04-20 MED ORDER — OXYCODONE HCL 5 MG/5ML PO SOLN: 5.0000 mg | Freq: Once | ORAL | Status: DC | PRN

## 2022-04-20 MED ORDER — MEPERIDINE HCL 25 MG/ML IJ SOLN: 6.2500 mg | INTRAMUSCULAR | Status: DC | PRN

## 2022-04-20 MED ORDER — POVIDONE-IODINE 10 % EX SWAB: 2.0000 | Freq: Once | CUTANEOUS | Status: DC

## 2022-04-20 MED ORDER — LIDOCAINE 2% (20 MG/ML) 5 ML SYRINGE
INTRAMUSCULAR | Status: DC | PRN
  Administered 2022-04-20: 100 mg via INTRAVENOUS

## 2022-04-20 MED ORDER — FENTANYL CITRATE (PF) 100 MCG/2ML IJ SOLN
INTRAMUSCULAR | Status: DC | PRN
  Administered 2022-04-20 (×2): 50 ug via INTRAVENOUS

## 2022-04-20 MED ORDER — LIDOCAINE HCL (PF) 2 % IJ SOLN
INTRAMUSCULAR | Status: AC
  Filled 2022-04-20: qty 5

## 2022-04-20 MED ORDER — ACETAMINOPHEN 325 MG PO TABS
ORAL_TABLET | ORAL | Status: DC | PRN
  Administered 2022-04-20: 1000 mg via ORAL

## 2022-04-20 MED ORDER — CEFAZOLIN SODIUM-DEXTROSE 2-4 GM/100ML-% IV SOLN
INTRAVENOUS | Status: AC
  Filled 2022-04-20: qty 100

## 2022-04-20 MED ORDER — ACETAMINOPHEN 500 MG PO TABS
ORAL_TABLET | ORAL | Status: AC
  Filled 2022-04-20: qty 2

## 2022-04-20 MED ORDER — OXYCODONE HCL 5 MG PO TABS: 5.0000 mg | ORAL_TABLET | Freq: Once | ORAL | Status: DC | PRN

## 2022-04-20 MED ORDER — KETOROLAC TROMETHAMINE 30 MG/ML IJ SOLN
INTRAMUSCULAR | Status: AC
  Filled 2022-04-20: qty 1

## 2022-04-20 MED ORDER — CARBOPROST TROMETHAMINE 250 MCG/ML IM SOLN
INTRAMUSCULAR | Status: AC
  Filled 2022-04-20: qty 1

## 2022-04-20 MED ORDER — SCOPOLAMINE 1 MG/3DAYS TD PT72
1.0000 | MEDICATED_PATCH | TRANSDERMAL | Status: DC
  Administered 2022-04-20: 1.5 mg via TRANSDERMAL

## 2022-04-20 MED ORDER — LACTATED RINGERS IV SOLN: INTRAVENOUS | Status: DC

## 2022-04-20 MED ORDER — DIPHENHYDRAMINE HCL 50 MG/ML IJ SOLN
INTRAMUSCULAR | Status: AC
  Filled 2022-04-20: qty 1

## 2022-04-20 MED ORDER — DEXAMETHASONE SODIUM PHOSPHATE 10 MG/ML IJ SOLN
INTRAMUSCULAR | Status: AC
  Filled 2022-04-20: qty 1

## 2022-04-20 MED ORDER — LIDOCAINE HCL 1 % IJ SOLN
INTRAMUSCULAR | Status: DC | PRN
  Administered 2022-04-20 (×2): 20 mL

## 2022-04-20 MED ORDER — LIDOCAINE HCL (PF) 2 % IJ SOLN
INTRAMUSCULAR | Status: DC | PRN
  Administered 2022-04-20: 1.5 mg/kg/h via INTRADERMAL

## 2022-04-20 MED ORDER — SOD CITRATE-CITRIC ACID 500-334 MG/5ML PO SOLN: 30.0000 mL | ORAL | Status: DC

## 2022-04-20 MED ORDER — PROPOFOL 500 MG/50ML IV EMUL
INTRAVENOUS | Status: DC | PRN
  Administered 2022-04-20: 200 ug/kg/min via INTRAVENOUS

## 2022-04-20 SURGICAL SUPPLY — 34 items
CATH ROBINSON RED A/P 16FR (CATHETERS) ×3 IMPLANT
CLIP FILSHIE TUBAL LIGA STRL (Clip) ×1 IMPLANT
DECANTER SPIKE VIAL GLASS SM (MISCELLANEOUS) ×3 IMPLANT
DEVICE MYOSURE LITE (MISCELLANEOUS) IMPLANT
DEVICE MYOSURE REACH (MISCELLANEOUS) IMPLANT
DILATOR CANAL MILEX (MISCELLANEOUS) IMPLANT
DRSG TELFA 3X8 NADH (GAUZE/BANDAGES/DRESSINGS) ×3 IMPLANT
ELECT REM PT RETURN 9FT ADLT (ELECTROSURGICAL)
ELECTRODE REM PT RTRN 9FT ADLT (ELECTROSURGICAL) IMPLANT
FILTER UTR ASPR ASSEMBLY (MISCELLANEOUS) ×3 IMPLANT
GAUZE 4X4 16PLY ~~LOC~~+RFID DBL (SPONGE) ×3 IMPLANT
GLOVE BIO SURGEON STRL SZ8 (GLOVE) ×3 IMPLANT
GOWN STRL REUS W/TWL XL LVL3 (GOWN DISPOSABLE) ×3 IMPLANT
HOSE CONNECTING 18IN BERKELEY (TUBING) ×3 IMPLANT
IV NS IRRIG 3000ML ARTHROMATIC (IV SOLUTION) ×6 IMPLANT
KIT BERKELEY 1ST TRI 3/8 NO TR (MISCELLANEOUS) ×3 IMPLANT
KIT BERKELEY 1ST TRIMESTER 3/8 (MISCELLANEOUS) ×6 IMPLANT
KIT PROCEDURE FLUENT (KITS) ×3 IMPLANT
KIT TURNOVER CYSTO (KITS) ×3 IMPLANT
NS IRRIG 500ML POUR BTL (IV SOLUTION) ×3 IMPLANT
PACK VAGINAL MINOR WOMEN LF (CUSTOM PROCEDURE TRAY) ×3 IMPLANT
PAD DRESSING TELFA 3X8 NADH (GAUZE/BANDAGES/DRESSINGS) ×2 IMPLANT
PAD OB MATERNITY 4.3X12.25 (PERSONAL CARE ITEMS) ×3 IMPLANT
PAD PREP 24X48 CUFFED NSTRL (MISCELLANEOUS) ×3 IMPLANT
SEAL CERVICAL OMNI LOK (ABLATOR) IMPLANT
SEAL ROD LENS SCOPE MYOSURE (ABLATOR) ×3 IMPLANT
SET BERKELEY SUCTION TUBING (SUCTIONS) ×3 IMPLANT
TOWEL OR 17X26 10 PK STRL BLUE (TOWEL DISPOSABLE) ×3 IMPLANT
TRAP TISSUE FILTER (MISCELLANEOUS) ×6 IMPLANT
TROCAR Z-THREAD BLADED 5X100MM (TROCAR) ×1 IMPLANT
VACURETTE 10 RIGID CVD (CANNULA) IMPLANT
VACURETTE 7MM CVD STRL WRAP (CANNULA) IMPLANT
VACURETTE 8 RIGID CVD (CANNULA) IMPLANT
VACURETTE 9 RIGID CVD (CANNULA) IMPLANT

## 2022-04-20 NOTE — Transfer of Care (Signed)
Immediate Anesthesia Transfer of Care Note  Patient: Erin Morris  Procedure(s) Performed: Procedure(s) (LRB): DILATATION & CURETTAGE/HYSTEROSCOPY WITH POSSIBLE MYOSURE (N/A) LAPAROSCOPIC BILATERAL TUBAL LIGATION WITH FILISHE CLIPS (Bilateral) DILATATION AND EVACUATION (N/A)  Patient Location: PACU  Anesthesia Type: General  Level of Consciousness: awake, alert  and oriented  Airway & Oxygen Therapy: Patient Spontanous Breathing and Patient connected to nasal cannula oxygen  Post-op Assessment: Report given to PACU RN and Post -op Vital signs reviewed and stable  Post vital signs: Reviewed and stable  Complications: No apparent anesthesia complications  Last Vitals:  Vitals Value Taken Time  BP 114/69 04/20/22 1404  Temp    Pulse 71 04/20/22 1406  Resp 14 04/20/22 1406  SpO2 100 % 04/20/22 1406  Vitals shown include unvalidated device data.  Last Pain:  Vitals:   04/20/22 1005  TempSrc: Oral  PainSc: 0-No pain      Patients Stated Pain Goal: 2 (24/23/53 6144)  Complications: No notable events documented.

## 2022-04-20 NOTE — Anesthesia Preprocedure Evaluation (Signed)
Anesthesia Evaluation  Patient identified by MRN, date of birth, ID band Patient awake    Reviewed: Allergy & Precautions, H&P , NPO status , Patient's Chart, lab work & pertinent test results  History of Anesthesia Complications (+) PONV and history of anesthetic complications  Airway Mallampati: II  TM Distance: >3 FB Neck ROM: full    Dental no notable dental hx. (+) Teeth Intact   Pulmonary neg pulmonary ROS,    Pulmonary exam normal breath sounds clear to auscultation       Cardiovascular + CAD, + Past MI and + Cardiac Stents  Normal cardiovascular exam Rhythm:regular Rate:Normal     Neuro/Psych  Headaches, negative psych ROS   GI/Hepatic negative GI ROS, Neg liver ROS,   Endo/Other  negative endocrine ROS  Renal/GU negative Renal ROS  negative genitourinary   Musculoskeletal   Abdominal (+) + obese,   Peds  Hematology negative hematology ROS (+)   Anesthesia Other Findings   Reproductive/Obstetrics                             Anesthesia Physical  Anesthesia Plan  ASA: 3  Anesthesia Plan: General   Post-op Pain Management: Dilaudid IV, Toradol IV (intra-op)* and Ofirmev IV (intra-op)*   Induction: Intravenous  PONV Risk Score and Plan: 4 or greater and Ondansetron, Dexamethasone, Midazolam, Scopolamine patch - Pre-op and Treatment may vary due to age or medical condition  Airway Management Planned: Oral ETT  Additional Equipment:   Intra-op Plan:   Post-operative Plan: Extubation in OR  Informed Consent: I have reviewed the patients History and Physical, chart, labs and discussed the procedure including the risks, benefits and alternatives for the proposed anesthesia with the patient or authorized representative who has indicated his/her understanding and acceptance.       Plan Discussed with:   Anesthesia Plan Comments:         Anesthesia Quick  Evaluation

## 2022-04-20 NOTE — Anesthesia Procedure Notes (Signed)
Procedure Name: Intubation Date/Time: 04/20/2022 12:58 PM  Performed by: Mechele Claude, CRNAPre-anesthesia Checklist: Patient identified, Emergency Drugs available, Suction available and Patient being monitored Patient Re-evaluated:Patient Re-evaluated prior to induction Oxygen Delivery Method: Circle system utilized Preoxygenation: Pre-oxygenation with 100% oxygen Induction Type: IV induction Ventilation: Mask ventilation without difficulty Laryngoscope Size: Mac and 3 Tube type: Oral Tube size: 7.0 mm Number of attempts: 1 Airway Equipment and Method: Stylet and Oral airway Placement Confirmation: ETT inserted through vocal cords under direct vision, positive ETCO2 and breath sounds checked- equal and bilateral Secured at: 21 cm Tube secured with: Tape Dental Injury: Teeth and Oropharynx as per pre-operative assessment

## 2022-04-20 NOTE — Progress Notes (Signed)
04/20/2022  1:59 PM  PATIENT:  Erin Morris  36 y.o. female  PRE-OPERATIVE DIAGNOSIS:  POLYP VS RETAINED POC; DESIRES STERILITY  POST-OPERATIVE DIAGNOSIS:  POLYP VS RETAINED POC; DESIRES STERILITY  PROCEDURE:  Procedure(s): DILATATION & CURETTAGE/HYSTEROSCOPY WITH POSSIBLE MYOSURE (N/A) LAPAROSCOPIC BILATERAL TUBAL LIGATION WITH FILISHE CLIPS (Bilateral) DILATATION AND EVACUATION (N/A)  SURGEON:  Surgeon(s) and Role:    * Everlene Farrier, MD - Primary  PHYSICIAN ASSISTANT:   ASSISTANTS: none   ANESTHESIA:   general  EBL:  30 mL   BLOOD ADMINISTERED:none  DRAINS: none   LOCAL MEDICATIONS USED:  LIDOCAINE  and Amount: 40 ml  SPECIMEN:  Source of Specimen:  POC  DISPOSITION OF SPECIMEN:  PATHOLOGY  COUNTS:  YES  TOURNIQUET:  * No tourniquets in log *  DICTATION: .Other Dictation: Dictation Number [id number]  PLAN OF CARE: Discharge to home after PACU  PATIENT DISPOSITION:  PACU - hemodynamically stable.   Delay start of Pharmacological VTE agent (>24hrs) due to surgical blood loss or risk of bleeding: not applicable

## 2022-04-20 NOTE — H&P (Signed)
Erin Morris is an 36 y.o. female G2P1 with a suspected incomplete AB. She is followed closely in office for 5 5/7 wk IU gestational sac that developed heavy bleeding. FU U/S was suspicious for a small amount of retained POC treated with misoprostol. Bleeding is now irregular and U/S is still suspicious  for a small amount of retained POC with irregular bleeding. She also wants BTL. A+.  Pertinent Gynecological History: Menses: flow is moderate Bleeding:  Contraception: none DES exposure: denies Blood transfusions: none Sexually transmitted diseases: no past history Previous GYN Procedures:  LEEP   Last mammogram: normal Date:  Last pap:  Date:  OB History: G2, P1   Menstrual History: Menarche age:  Patient's last menstrual period was 02/06/2022 (approximate).    Past Medical History:  Diagnosis Date   Angio-edema    Coronary atherosclerosis of native coronary artery    BMS to mid circumflex 2010   Headache    HPV in female    NSTEMI (non-ST elevated myocardial infarction) ([place])    2010 - had prior workup for hypercoagulable state that was negative   PONV (postoperative nausea and vomiting)    Urticaria    Vaginal Pap smear, abnormal     Past Surgical History:  Procedure Laterality Date   addnoids     ADENOIDECTOMY     toddler   COLPOSCOPY     LEFT HEART CATHETERIZATION WITH CORONARY ANGIOGRAM N/A 12/15/2013   Procedure: LEFT HEART CATHETERIZATION WITH CORONARY ANGIOGRAM;  Surgeon: Troy Sine, MD;  Location: [facility] CATH LAB;  Service: Cardiovascular;  Laterality: N/A;   SHOULDER SURGERY Left 2008   TONSILLECTOMY     WISDOM TOOTH EXTRACTION     36yr old    Family History  Problem Relation Age of Onset   Thyroid disease Mother    Heart disease Paternal Grandfather    Colon cancer Paternal Grandmother     Social History:  reports that she has never smoked. She has never used smokeless tobacco. She reports that she does not currently use alcohol. She reports  that she does not use drugs.  Allergies:  Allergies  Allergen Reactions   Nsaids Other (See Comments)    stent    Medications Prior to Admission  Medication Sig Dispense Refill Last Dose   simvastatin (ZOCOR) 5 MG tablet Take 1 tablet (5 mg total) by mouth every other day. 45 tablet 3 Past Week   aspirin 81 MG EC tablet Take 81 mg by mouth daily.     04/18/2022   loratadine (CLARITIN) 10 MG tablet Take 10 mg by mouth daily.   More than a month   misoprostol (CYTOTEC) 200 MCG tablet Place 4 tablets (800 mcg total) vaginally once for 1 dose. If no results in 24 hours repeat dose. 4 tablet 1 04/03/2022   nitroGLYCERIN (NITROSTAT) 0.4 MG SL tablet Place 1 tablet (0.4 mg total) under the tongue every 5 (five) minutes as needed for chest pain. 25 tablet 3    ofloxacin (FLOXIN) 0.3 % OTIC solution Place 1 gtt in affected eye(s) QID prn pink eye 5 mL 0    paragard intrauterine copper IUD IUD ParaGard T 380A 380 square mm intrauterine device  Take 1 device by intrauterine route.       Review of Systems  Constitutional:  Negative for fever.    Blood pressure 123/68, pulse 64, temperature 98.1 F (36.7 C), temperature source Oral, resp. rate 14, height '5\' 7"'$  (1.702 m), weight 96.9 kg, last  menstrual period 02/06/2022, SpO2 100 %, unknown if currently breastfeeding. Physical Exam Cardiovascular:     Rate and Rhythm: Normal rate.  Pulmonary:     Effort: Pulmonary effort is normal.     Results for orders placed or performed during the hospital encounter of 04/20/22 (from the past 24 hour(s))  Type and screen [facility]     Status: None   Collection Time: 04/20/22  9:43 AM  Result Value Ref Range   ABO/RH(D) A POS    Antibody Screen NEG    Sample Expiration      04/23/2022,2359 Performed at [facility], [place] [address], [place], [place] [address]   CBC     Status: None   Collection Time: 04/20/22  9:43 AM  Result Value Ref Range   WBC 4.2 4.0 - 10.5  K/uL   RBC 4.55 3.87 - 5.11 MIL/uL   Hemoglobin 14.6 12.0 - 15.0 g/dL   HCT 43.7 36.0 - 46.0 %   MCV 96.0 80.0 - 100.0 fL   MCH 32.1 26.0 - 34.0 pg   MCHC 33.4 30.0 - 36.0 g/dL   RDW 12.4 11.5 - 15.5 %   Platelets 152 150 - 400 K/uL   nRBC 0.0 0.0 - 0.2 %  I-STAT, chem 8     Status: None   Collection Time: 04/20/22 10:21 AM  Result Value Ref Range   Sodium 142 135 - 145 mmol/L   Potassium 4.3 3.5 - 5.1 mmol/L   Chloride 106 98 - 111 mmol/L   BUN 12 6 - 20 mg/dL   Creatinine, Ser 0.50 0.44 - 1.00 mg/dL   Glucose, Bld 95 70 - 99 mg/dL   Calcium, Ion 1.22 1.15 - 1.40 mmol/L   TCO2 23 22 - 32 mmol/L   Hemoglobin 14.6 12.0 - 15.0 g/dL   HCT 43.0 36.0 - 46.0 %    No results found.  Assessment/Plan: 36 yo G2P1 with possible incomplete AB Suction D&C, H/S, possible myosure, L/S with BTL with filshie clips discussed Risks reviewed including infection, uterine perforation, organ damage, bleeding/transfusion-HIV/Hep, DVT/PE, pneumonia. BTL with permanence, failure rate and increased ectopic risk discussed Patient states she understands and agrees Hx of MI with Stent. Cardiology clearance done.  PO instructions reviewed. FU in office 1-2 weeks  Shon Millet II 04/20/2022, 12:37 PM

## 2022-04-20 NOTE — Discharge Instructions (Addendum)
   D & C Home care Instructions:   Personal hygiene:  Used sanitary napkins for vaginal drainage not tampons. Shower or tub bathe the day after your procedure. No douching until bleeding stops. Always wipe from front to back after  Elimination.  Activity: Do not drive or operate any equipment today. The effects of the anesthesia are still present and drowsiness may result. Rest today, not necessarily flat bed rest, just take it easy. You may resume your normal activity in one to 2 days.  Sexual activity: No intercourse for one week or as indicated by your physician  Diet: Eat a light diet as desired this evening. You may resume a regular diet tomorrow.  Return to work: One to 2 days.  General Expectations of your surgery: Vaginal bleeding should be no heavier than a normal period. Spotting may continue up to 10 days. Mild cramps may continue for a couple of days. You may have a regular period in 2-6 weeks.  Unexpected observations call your doctor if these occur: persistent or heavy bleeding. Severe abdominal cramping or pain. Elevation of temperature greater than 100F.   Post Anesthesia Home Care Instructions  Activity: Get plenty of rest for the remainder of the day. A responsible individual must stay with you for 24 hours following the procedure.  For the next 24 hours, DO NOT: -Drive a car -Paediatric nurse -Drink alcoholic beverages -Take any medication unless instructed by your physician -Make any legal decisions or sign important papers.  Meals: Start with liquid foods such as gelatin or soup. Progress to regular foods as tolerated. Avoid greasy, spicy, heavy foods. If nausea and/or vomiting occur, drink only clear liquids until the nausea and/or vomiting subsides. Call your physician if vomiting continues.  Special Instructions/Symptoms: Your throat may feel dry or sore from the anesthesia or the breathing tube placed in your throat during surgery. If this causes  discomfort, gargle with warm salt water. The discomfort should disappear within 24 hours.  If you had a scopolamine patch placed behind your ear for the management of post- operative nausea and/or vomiting:  1. The medication in the patch is effective for 72 hours, after which it should be removed.  Wrap patch in a tissue and discard in the trash. Wash hands thoroughly with soap and water. 2. You may remove the patch earlier than 72 hours if you experience unpleasant side effects which may include dry mouth, dizziness or visual disturbances. 3. Avoid touching the patch. Wash your hands with soap and water after contact with the patch.  You may remove the scopolamine patch on or before Thursday 04/23/22

## 2022-04-20 NOTE — Op Note (Unsigned)
NAME: Erin Morris, Erin Morris MEDICAL RECORD NO: [id number] ACCOUNT NO: [id number] DATE OF BIRTH: Dec 29, 1985 FACILITY: [facility] LOCATION: [facility] PHYSICIAN: Daleen Bo. Lyn Hollingshead, MD  Operative Report   DATE OF PROCEDURE: 04/20/2022  PREOPERATIVE DIAGNOSIS: 1.  Incomplete abortion. 2.  Desires permanent sterilization.  POSTOPERATIVE DIAGNOSIS: 1.  Incomplete abortion. 2.  Desires permanent sterilization.  PROCEDURE:  Laparoscopy with bilateral tubal ligation with Filshie clips and suction D and E with hysteroscopy.  SURGEON:  Daleen Bo. Lyn Hollingshead, MD  ANESTHESIA:  General with endotracheal intubation.  ESTIMATED BLOOD LOSS:  30 mL  SPECIMENS:  Products of conception to pathology.  INDICATION AND CONSENT:  This patient is a 36 year old G2 P1 with a probable incomplete miscarriage.  She has had irregular bleeding, sometimes heavy and ultrasound is suspicious for a small amount of retained products of conception.  She also desires  permanent sterilization.  Hysteroscopy, dilation and curettage and/or evacuation, possible MyoSure resection as well as laparoscopy with bilateral tubal ligation with Filshie clips was discussed preoperatively.  Potential risks and complications were  discussed preoperatively including but not limited to infection, uterine perforation, organ damage, bleeding requiring transfusion of blood products with HIV and hepatitis acquisition, DVT, PE, pneumonia, intrauterine synechia.  Tubal ligation with  permanence, failure rate, and increased ectopic risk was discussed.  She states she understands and agrees and consent is signed on the chart.  FINDINGS:  The endometrial cavity is clean.  Status post curettage.  Both fallopian tube, ostia are identified and there was good distention of the cavity. Abdominally upper abdomen is grossly normal.  In the pelvis, uterus is 6 weeks in size, smooth in  contour.  Tubes and ovaries were normal bilaterally.  DESCRIPTION OF  PROCEDURE:  The patient was taken to the operating room where she was identified, placed in the dorsal supine position and general anesthesia was induced via endotracheal intubation.  She was placed in the dorsal lithotomy position.  She  was prepped abdominally with ChloraPrep, vaginally with Betadine. Bladder straight catheterized and she was draped in sterile fashion.  Timeout was done.  Bivalve speculum was placed in the vagina and the anterior cervical lip was injected with 1%  lidocaine and grasped with a single tooth tenaculum.  Paracervical block was placed at 2, 4, 5, 7, 8, and 10 o'clock positions with approximately 20 mL of the same solution.  Cervix was gently progressively dilated.  A #7 flexible suction curette was  then easily passed and suction curettage was carried out for a small amount of tissue.  Sharp curettage was then done and the cavity feels clean.  Hysteroscope was then placed in the endocervical canal and advanced under direct visualization.  Both  fallopian tube ostia identified.  Good distention of the cavity was noted and there were no masses.  Hysteroscope was withdrawn.  The single tooth tenaculum was replaced with a Hulka tenaculum and attention was turned to the abdomen.  The infraumbilical  and suprapubic areas were injected in the midline with approximately 10 mL total of 1% plain lidocaine.  A small infraumbilical incision was made and a disposable Veress needle was placed on the first attempt without difficulty.  Good syringe and drop  test are noted.  Two liters of gas were then insufflated under low pressure with good tympany in the right upper quadrant.  Veress needle was removed and a 10/11 Xcel bladeless disposable trocar sleeve was placed using direct visualization with the  diagnostic scope.  The operative scope was  then used.  A small suprapubic incision was made and a 5 mm disposable trocar sleeve was placed under direct visualization.  The above findings were  noted.  The right fallopian tube was identified from cornu to  fimbria and a grasper was used to elevate the tube. Filshie clip was placed in the proximal one-third of the tube.  Similar procedure was carried out on the left tube.  Filshie clip applicator was then removed and careful inspection reveals a full width  of the tube within the clip.  The healed clip can be visualized through the mesosalpinx bilaterally.  There was good hemostasis all around.  The remaining approximately 10 mL of 1% lidocaine is instilled into the peritoneal cavity on the tubes.   Suprapubic trocar sleeve was removed.  Pneumoperitoneum was reduced and the umbilical trocar sleeve was removed.  The umbilical incision was closed with 0 Vicryl in the deeper subcutaneous layers under good visualization and the skin was closed in both  incisions with interrupted 4-0 Vicryl.  Benzoin was placed on both incisions.  Hulka tenaculum was removed.  Good hemostasis was noted.  All counts were correct.  The patient was awakened and taken to recovery room in stable condition.   PUS D: 04/20/2022 2:07:29 pm T: 04/20/2022 4:32:00 pm  JOB: [id number]/ [id number]

## 2022-04-21 ENCOUNTER — Encounter (HOSPITAL_BASED_OUTPATIENT_CLINIC_OR_DEPARTMENT_OTHER): Payer: Self-pay | Admitting: Obstetrics and Gynecology

## 2022-04-21 NOTE — Anesthesia Postprocedure Evaluation (Signed)
Anesthesia Post Note  Patient: COURTNY BENNISON  Procedure(s) Performed: DILATATION & CURETTAGE/HYSTEROSCOPY (Vagina ) LAPAROSCOPIC BILATERAL TUBAL LIGATION WITH FILISHE CLIPS (Bilateral: Abdomen) DILATATION AND EVACUATION (Vagina )     Patient location during evaluation: PACU Anesthesia Type: General Level of consciousness: awake and alert Pain management: pain level controlled Vital Signs Assessment: post-procedure vital signs reviewed and stable Respiratory status: spontaneous breathing, nonlabored ventilation and respiratory function stable Cardiovascular status: blood pressure returned to baseline and stable Postop Assessment: no apparent nausea or vomiting Anesthetic complications: no   No notable events documented.  Last Vitals:  Vitals:   04/20/22 1415 04/20/22 1500  BP: 100/60 (!) 96/48  Pulse: 63   Resp: 12 14  Temp:  36.6 C  SpO2: 100% 98%    Last Pain:  Vitals:   04/21/22 1029  TempSrc:   PainSc: 1    Pain Goal: Patients Stated Pain Goal: 2 (04/20/22 1005)                 Lynda Rainwater

## 2022-04-22 LAB — SURGICAL PATHOLOGY

## 2022-05-01 DIAGNOSIS — Z09 Encounter for follow-up examination after completed treatment for conditions other than malignant neoplasm: Secondary | ICD-10-CM | POA: Diagnosis not present

## 2022-06-11 ENCOUNTER — Telehealth: Payer: 59 | Admitting: Physician Assistant

## 2022-06-11 ENCOUNTER — Other Ambulatory Visit (HOSPITAL_COMMUNITY): Payer: Self-pay

## 2022-06-11 DIAGNOSIS — J019 Acute sinusitis, unspecified: Secondary | ICD-10-CM

## 2022-06-11 DIAGNOSIS — B9689 Other specified bacterial agents as the cause of diseases classified elsewhere: Secondary | ICD-10-CM | POA: Diagnosis not present

## 2022-06-11 MED ORDER — AMOXICILLIN-POT CLAVULANATE 875-125 MG PO TABS
1.0000 | ORAL_TABLET | Freq: Two times a day (BID) | ORAL | 0 refills | Status: DC
  Filled 2022-06-11: qty 14, 7d supply, fill #0

## 2022-06-11 NOTE — Progress Notes (Signed)
I have spent 5 minutes in review of e-visit questionnaire, review and updating patient chart, medical decision making and response to patient.   Nanie Dunkleberger Cody Collette Pescador, PA-C    

## 2022-06-11 NOTE — Progress Notes (Signed)

## 2022-06-11 NOTE — Progress Notes (Signed)
Message sent to patient requesting further input regarding current symptoms. Awaiting patient response.  

## 2022-07-11 ENCOUNTER — Other Ambulatory Visit: Payer: Self-pay | Admitting: Cardiology

## 2022-07-13 ENCOUNTER — Other Ambulatory Visit (HOSPITAL_COMMUNITY): Payer: Self-pay

## 2022-07-13 MED ORDER — SIMVASTATIN 5 MG PO TABS
5.0000 mg | ORAL_TABLET | ORAL | 0 refills | Status: DC
  Filled 2022-07-13: qty 15, 30d supply, fill #0
  Filled 2022-07-13: qty 30, 60d supply, fill #0

## 2022-07-21 ENCOUNTER — Other Ambulatory Visit (HOSPITAL_COMMUNITY): Payer: Self-pay

## 2022-07-21 ENCOUNTER — Ambulatory Visit: Payer: 59 | Admitting: Family Medicine

## 2022-07-21 ENCOUNTER — Other Ambulatory Visit: Payer: Self-pay | Admitting: Cardiology

## 2022-07-21 DIAGNOSIS — F988 Other specified behavioral and emotional disorders with onset usually occurring in childhood and adolescence: Secondary | ICD-10-CM | POA: Diagnosis not present

## 2022-07-21 MED ORDER — ATOMOXETINE HCL 40 MG PO CAPS
40.0000 mg | ORAL_CAPSULE | Freq: Every day | ORAL | 1 refills | Status: DC
  Filled 2022-07-21: qty 90, 90d supply, fill #0

## 2022-07-21 MED ORDER — NITROGLYCERIN 0.4 MG SL SUBL
0.4000 mg | SUBLINGUAL_TABLET | SUBLINGUAL | 3 refills | Status: DC | PRN
  Filled 2022-07-21: qty 25, 7d supply, fill #0

## 2022-07-21 NOTE — Assessment & Plan Note (Signed)
Based on her symptoms, meets DSM-V criteria for ADD, inattention. Trial of Strattera.  Advised against using stimulants given her cardiac history.

## 2022-07-21 NOTE — Patient Instructions (Signed)
Medication as prescribed.   We will follow up with this next month.  Take care  Dr. Lacinda Axon

## 2022-07-21 NOTE — Progress Notes (Signed)
Subjective:  Patient ID: Erin Morris, female    DOB: 05-22-1986  Age: 36 y.o. MRN: [id number]  CC: Chief Complaint  Patient presents with   discuss focus and attention    Patient states she recently changed jobs and this has made it more obvious     HPI:  36 year old female presents for evaluation the above.  Patiently has recently switched jobs.  She is now working in the [employer].  It is quite stressful.  She is having to navigate lots of moving parts.  She states that she is having difficulty focusing and paying attention to details.  She is also having difficulty with retention of information she is given.  She states that she is always had difficulty with this but has been able to manage without the use of medication.  As of late, she has not been able to manage effectively.  She does feel stressed and anxious.  Patient Active Problem List   Diagnosis Date Noted   ADD (attention deficit disorder) 07/21/2022   Mixed hyperlipidemia 05/14/2009   Coronary artery disease involving native coronary artery of native heart without angina pectoris 04/16/2009    Social Hx   Social History   Socioeconomic History   Marital status: Married    Spouse name: Not on file   Number of children: Not on file   Years of education: Not on file   Highest education level: Not on file  Occupational History   Occupation: radiology tech  Tobacco Use   Smoking status: Never   Smokeless tobacco: Never  Vaping Use   Vaping Use: Never used  Substance and Sexual Activity   Alcohol use: Not Currently    Alcohol/week: 0.0 standard drinks of alcohol    Comment: maybe once a month   Drug use: No   Sexual activity: Yes    Birth control/protection: I.U.D.  Other Topics Concern   Not on file  Social History Narrative   Single   Lives with her boyfriend   No pregnancy   Social Determinants of Health   Financial Resource Strain: Not on file  Food Insecurity: Not on file  Transportation  Needs: Not on file  Physical Activity: Not on file  Stress: Not on file  Social Connections: Not on file    Review of Systems Per HPI  Objective:  BP 105/71   Pulse 85   Ht '5\' 7"'$  (1.702 m)   Wt 213 lb 9.6 oz (96.9 kg)   BMI 33.45 kg/m      07/21/2022    8:17 AM 04/20/2022    3:00 PM 04/20/2022    2:15 PM  BP/Weight  Systolic BP 024 96 097  Diastolic BP 71 48 60  Wt. (Lbs) 213.6    BMI 33.45 kg/m2      Physical Exam Vitals and nursing note reviewed.  Constitutional:      General: She is not in acute distress.    Appearance: Normal appearance.  HENT:     Head: Normocephalic and atraumatic.  Eyes:     General:        Right eye: No discharge.        Left eye: No discharge.     Conjunctiva/sclera: Conjunctivae normal.  Cardiovascular:     Rate and Rhythm: Normal rate and regular rhythm.  Pulmonary:     Effort: Pulmonary effort is normal.     Breath sounds: Normal breath sounds. No wheezing, rhonchi or rales.  Neurological:  Mental Status: She is alert.     Lab Results  Component Value Date   WBC 4.2 04/20/2022   HGB 14.6 04/20/2022   HCT 43.0 04/20/2022   PLT 152 04/20/2022   GLUCOSE 95 04/20/2022   CHOL 103 08/01/2020   TRIG 28 08/01/2020   HDL 51 08/01/2020   LDLCALC 46 08/01/2020   ALT 32 03/21/2019   AST 22 03/21/2019   NA 142 04/20/2022   K 4.3 04/20/2022   CL 106 04/20/2022   CREATININE 0.50 04/20/2022   BUN 12 04/20/2022   CO2 24 03/21/2019   TSH 3.760 03/21/2019   INR 1.04 12/15/2013     Assessment & Plan:   Problem List Items Addressed This Visit       Other   ADD (attention deficit disorder)    Based on her symptoms, meets DSM-V criteria for ADD, inattention. Trial of Strattera.  Advised against using stimulants given her cardiac history.       Meds ordered this encounter  Medications   atomoxetine (STRATTERA) 40 MG capsule    Sig: Take 1 capsule (40 mg total) by mouth daily.    Dispense:  90 capsule    Refill:  1     Follow-up: Next month  [place]

## 2022-07-30 ENCOUNTER — Encounter: Payer: Self-pay | Admitting: Family Medicine

## 2022-08-06 ENCOUNTER — Ambulatory Visit (INDEPENDENT_AMBULATORY_CARE_PROVIDER_SITE_OTHER): Payer: 59 | Admitting: Family Medicine

## 2022-08-06 ENCOUNTER — Encounter: Payer: Self-pay | Admitting: Family Medicine

## 2022-08-06 VITALS — BP 107/68 | HR 71 | Temp 97.7°F | Ht 66.5 in | Wt 208.8 lb

## 2022-08-06 DIAGNOSIS — Z130 Encounter for screening for diseases of the blood and blood-forming organs and certain disorders involving the immune mechanism: Secondary | ICD-10-CM

## 2022-08-06 DIAGNOSIS — I2510 Atherosclerotic heart disease of native coronary artery without angina pectoris: Secondary | ICD-10-CM

## 2022-08-06 DIAGNOSIS — Z0000 Encounter for general adult medical examination without abnormal findings: Secondary | ICD-10-CM

## 2022-08-06 NOTE — Patient Instructions (Signed)
Labs when you can.  Follow up annually or sooner if needed.  Take care  Dr. Lacinda Axon

## 2022-08-07 DIAGNOSIS — Z0000 Encounter for general adult medical examination without abnormal findings: Secondary | ICD-10-CM | POA: Insufficient documentation

## 2022-08-07 NOTE — Assessment & Plan Note (Signed)
Doing well.  She is going to see her OB/GYN for Pap smear. She has gotten her flu shot. Labs today.

## 2022-08-07 NOTE — Progress Notes (Signed)
Subjective:  Patient ID: Erin Morris, female    DOB: 04/26/1986  Age: 36 y.o. MRN: [id number]  CC: Chief Complaint  Patient presents with   Annual Exam    Pt arrives for wellness. Pt has GYN appt set up for pap and breast exam.     HPI:  36 year old female with a history of CAD s/p BMS to mid left Circumflex artery in 2010 (patent stent on relook cath 2015) and ADD presents for an annual physical.  Has upcoming appointment with OB/GYN for Pap smear.  Patient has had her flu vaccine.  She states that she is doing well. No chest pain or SOB.    Patient Active Problem List   Diagnosis Date Noted   Annual physical exam 08/07/2022   ADD (attention deficit disorder) 07/21/2022   Coronary artery disease involving native coronary artery of native heart without angina pectoris 04/16/2009    Social Hx   Social History   Socioeconomic History   Marital status: Married    Spouse name: Not on file   Number of children: Not on file   Years of education: Not on file   Highest education level: Not on file  Occupational History   Occupation: radiology tech  Tobacco Use   Smoking status: Never   Smokeless tobacco: Never  Vaping Use   Vaping Use: Never used  Substance and Sexual Activity   Alcohol use: Not Currently    Alcohol/week: 0.0 standard drinks of alcohol    Comment: maybe once a month   Drug use: No   Sexual activity: Yes    Birth control/protection: I.U.D.  Other Topics Concern   Not on file  Social History Narrative   Single   Lives with her boyfriend   No pregnancy   Social Determinants of Health   Financial Resource Strain: Not on file  Food Insecurity: Not on file  Transportation Needs: Not on file  Physical Activity: Not on file  Stress: Not on file  Social Connections: Not on file    Review of Systems Per HPI  Objective:  BP 107/68   Pulse 71   Temp 97.7 F (36.5 C)   Ht 5' 6.5" (1.689 m)   Wt 208 lb 12.8 oz (94.7 kg)   SpO2 98%    BMI 33.20 kg/m      08/06/2022    1:24 PM 07/21/2022    8:17 AM 04/20/2022    3:00 PM  BP/Weight  Systolic BP 147 092 96  Diastolic BP 68 71 48  Wt. (Lbs) 208.8 213.6   BMI 33.2 kg/m2 33.45 kg/m2     Physical Exam Vitals and nursing note reviewed.  Constitutional:      General: She is not in acute distress.    Appearance: Normal appearance.  HENT:     Head: Normocephalic and atraumatic.  Eyes:     General:        Right eye: No discharge.        Left eye: No discharge.     Conjunctiva/sclera: Conjunctivae normal.  Cardiovascular:     Rate and Rhythm: Normal rate and regular rhythm.  Pulmonary:     Effort: Pulmonary effort is normal.     Breath sounds: Normal breath sounds. No wheezing, rhonchi or rales.  Abdominal:     General: There is no distension.     Palpations: Abdomen is soft.     Tenderness: There is no abdominal tenderness.  Neurological:     Mental Status:  She is alert.  Psychiatric:        Mood and Affect: Mood normal.        Behavior: Behavior normal.     Lab Results  Component Value Date   WBC 4.2 04/20/2022   HGB 14.6 04/20/2022   HCT 43.0 04/20/2022   PLT 152 04/20/2022   GLUCOSE 95 04/20/2022   CHOL 103 08/01/2020   TRIG 28 08/01/2020   HDL 51 08/01/2020   LDLCALC 46 08/01/2020   ALT 32 03/21/2019   AST 22 03/21/2019   NA 142 04/20/2022   K 4.3 04/20/2022   CL 106 04/20/2022   CREATININE 0.50 04/20/2022   BUN 12 04/20/2022   CO2 24 03/21/2019   TSH 3.760 03/21/2019   INR 1.04 12/15/2013     Assessment & Plan:   Problem List Items Addressed This Visit       Cardiovascular and Mediastinum   Coronary artery disease involving native coronary artery of native heart without angina pectoris   Relevant Orders   CMP14+EGFR   Lipid panel   C-reactive protein   Homocysteine   Lipoprotein A (LPA)     Other   Annual physical exam - Primary    Doing well.  She is going to see her OB/GYN for Pap smear. She has gotten her flu  shot. Labs today.      Other Visit Diagnoses     Screening for deficiency anemia       Relevant Orders   CBC      Follow-up:  Annually  Thersa Salt DO [place]

## 2022-09-18 DIAGNOSIS — I2510 Atherosclerotic heart disease of native coronary artery without angina pectoris: Secondary | ICD-10-CM | POA: Diagnosis not present

## 2022-09-18 DIAGNOSIS — Z130 Encounter for screening for diseases of the blood and blood-forming organs and certain disorders involving the immune mechanism: Secondary | ICD-10-CM | POA: Diagnosis not present

## 2022-09-20 LAB — CMP14+EGFR
ALT: 19 IU/L (ref 0–32)
AST: 18 IU/L (ref 0–40)
Albumin/Globulin Ratio: 2.2 (ref 1.2–2.2)
Albumin: 4.6 g/dL (ref 3.9–4.9)
Alkaline Phosphatase: 57 IU/L (ref 44–121)
BUN/Creatinine Ratio: 19 (ref 9–23)
BUN: 14 mg/dL (ref 6–20)
Bilirubin Total: 0.7 mg/dL (ref 0.0–1.2)
CO2: 24 mmol/L (ref 20–29)
Calcium: 9.6 mg/dL (ref 8.7–10.2)
Chloride: 103 mmol/L (ref 96–106)
Creatinine, Ser: 0.72 mg/dL (ref 0.57–1.00)
Globulin, Total: 2.1 g/dL (ref 1.5–4.5)
Glucose: 99 mg/dL (ref 70–99)
Potassium: 5.2 mmol/L (ref 3.5–5.2)
Sodium: 142 mmol/L (ref 134–144)
Total Protein: 6.7 g/dL (ref 6.0–8.5)
eGFR: 111 mL/min/{1.73_m2} (ref >59)

## 2022-09-20 LAB — LIPID PANEL
Chol/HDL Ratio: 2.4 ratio (ref 0.0–4.4)
Cholesterol, Total: 126 mg/dL (ref 100–199)
HDL: 52 mg/dL (ref >39)
LDL Chol Calc (NIH): 61 mg/dL (ref 0–99)
Triglycerides: 59 mg/dL (ref 0–149)
VLDL Cholesterol Cal: 13 mg/dL (ref 5–40)

## 2022-09-20 LAB — CBC
Hematocrit: 44.8 % (ref 34.0–46.6)
Hemoglobin: 14.8 g/dL (ref 11.1–15.9)
MCH: 32.2 pg (ref 26.6–33.0)
MCHC: 33 g/dL (ref 31.5–35.7)
MCV: 97 fL (ref 79–97)
Platelets: 186 10*3/uL (ref 150–450)
RBC: 4.6 x10E6/uL (ref 3.77–5.28)
RDW: 12.4 % (ref 11.7–15.4)
WBC: 4.5 10*3/uL (ref 3.4–10.8)

## 2022-09-20 LAB — C-REACTIVE PROTEIN: CRP: 2 mg/L (ref 0–10)

## 2022-09-20 LAB — LIPOPROTEIN A (LPA): Lipoprotein (a): 8.4 nmol/L (ref <75.0)

## 2022-09-20 LAB — HOMOCYSTEINE: Homocysteine: 7.5 umol/L (ref 0.0–14.5)

## 2022-10-04 ENCOUNTER — Other Ambulatory Visit: Payer: Self-pay | Admitting: Cardiology

## 2022-10-05 ENCOUNTER — Other Ambulatory Visit (HOSPITAL_COMMUNITY): Payer: Self-pay

## 2022-10-05 MED ORDER — SIMVASTATIN 5 MG PO TABS
5.0000 mg | ORAL_TABLET | ORAL | 3 refills | Status: DC
  Filled 2022-10-05: qty 45, 90d supply, fill #0
  Filled 2023-01-04: qty 45, 90d supply, fill #1
  Filled 2023-04-02: qty 45, 90d supply, fill #2

## 2022-10-15 ENCOUNTER — Other Ambulatory Visit: Payer: Self-pay | Admitting: Family Medicine

## 2022-10-15 ENCOUNTER — Other Ambulatory Visit (HOSPITAL_COMMUNITY): Payer: Self-pay

## 2022-10-15 ENCOUNTER — Encounter: Payer: Self-pay | Admitting: Family Medicine

## 2022-10-15 DIAGNOSIS — Z01419 Encounter for gynecological examination (general) (routine) without abnormal findings: Secondary | ICD-10-CM | POA: Diagnosis not present

## 2022-10-15 DIAGNOSIS — Z124 Encounter for screening for malignant neoplasm of cervix: Secondary | ICD-10-CM | POA: Diagnosis not present

## 2022-10-15 DIAGNOSIS — Z6830 Body mass index (BMI) 30.0-30.9, adult: Secondary | ICD-10-CM | POA: Diagnosis not present

## 2022-10-15 MED ORDER — ATOMOXETINE HCL 60 MG PO CAPS
60.0000 mg | ORAL_CAPSULE | Freq: Every day | ORAL | 0 refills | Status: DC
  Filled 2022-10-15: qty 90, 90d supply, fill #0

## 2022-10-16 ENCOUNTER — Other Ambulatory Visit (HOSPITAL_COMMUNITY): Payer: Self-pay

## 2022-11-17 DIAGNOSIS — D485 Neoplasm of uncertain behavior of skin: Secondary | ICD-10-CM | POA: Diagnosis not present

## 2022-11-17 DIAGNOSIS — D2272 Melanocytic nevi of left lower limb, including hip: Secondary | ICD-10-CM | POA: Diagnosis not present

## 2022-11-19 ENCOUNTER — Other Ambulatory Visit: Payer: Self-pay

## 2023-01-04 ENCOUNTER — Other Ambulatory Visit (HOSPITAL_COMMUNITY): Payer: Self-pay

## 2023-01-07 ENCOUNTER — Other Ambulatory Visit (HOSPITAL_COMMUNITY): Payer: Self-pay

## 2023-01-07 ENCOUNTER — Other Ambulatory Visit: Payer: Self-pay | Admitting: Family Medicine

## 2023-01-08 MED ORDER — ATOMOXETINE HCL 60 MG PO CAPS
60.0000 mg | ORAL_CAPSULE | Freq: Every day | ORAL | 0 refills | Status: DC
  Filled 2023-01-08: qty 90, 90d supply, fill #0

## 2023-01-11 ENCOUNTER — Other Ambulatory Visit: Payer: Self-pay

## 2023-01-11 ENCOUNTER — Other Ambulatory Visit (HOSPITAL_COMMUNITY): Payer: Self-pay

## 2023-01-14 ENCOUNTER — Ambulatory Visit: Payer: Commercial Managed Care - PPO | Admitting: Cardiology

## 2023-04-19 ENCOUNTER — Other Ambulatory Visit: Payer: Self-pay | Admitting: Family Medicine

## 2023-04-20 ENCOUNTER — Other Ambulatory Visit (HOSPITAL_COMMUNITY): Payer: Self-pay

## 2023-04-20 MED ORDER — ATOMOXETINE HCL 60 MG PO CAPS
60.0000 mg | ORAL_CAPSULE | Freq: Every day | ORAL | 0 refills | Status: DC
  Filled 2023-04-20: qty 90, 90d supply, fill #0

## 2023-04-28 ENCOUNTER — Other Ambulatory Visit (HOSPITAL_COMMUNITY): Payer: Self-pay

## 2023-04-29 ENCOUNTER — Ambulatory Visit: Payer: Commercial Managed Care - PPO | Admitting: Cardiology

## 2023-05-20 NOTE — Progress Notes (Signed)
Chief Complaint  Patient presents with   Follow-up    CAD   History of Present Illness: 37 yo female with history of CAD who is here today for cardiac follow up. She had a coronary stent placed in 2010 in the setting an acute MI. Her Circumflex was occluded proximally. A bare metal stent was placed in the proximal circumflex artery (2.75 x 24 mm bare metal stent dilated to 3.0 mm). Her cath was performed by Dr. Riley Kill and he did not feel that this was consistent with spontaneous coronary artery dissection. She has done well since then. Repeat cardiac cath in 2015 in the setting of chest pain with patent Circumflex stent and no evidence of disease in the LAD or RCA.  Negative hypercoagulable testing. She has followed with Dr. Diona Browner in our [facility] office. 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   She is here today for follow up. She denies any chest pain, dyspnea, palpitations, lower extremity edema, orthopnea, PND, dizziness, near syncope or syncope.   Primary Care Physician: Tommie Sams, DO   Past Medical History:  Diagnosis Date   Angio-edema    Coronary atherosclerosis of native coronary artery    BMS to mid circumflex 2010   Headache    HPV in female    NSTEMI (non-ST elevated myocardial infarction) (HCC)    2010 - had prior workup for hypercoagulable state that was negative   PONV (postoperative nausea and vomiting)    Urticaria    Vaginal Pap smear, abnormal     Past Surgical History:  Procedure Laterality Date   addnoids     ADENOIDECTOMY     toddler   COLPOSCOPY     DILATATION & CURETTAGE/HYSTEROSCOPY WITH MYOSURE N/A 04/20/2022   Procedure: DILATATION & CURETTAGE/HYSTEROSCOPY;  Surgeon: Harold Hedge, MD;  Location: [facility];  Service: Gynecology;  Laterality: N/A;   DILATION AND EVACUATION N/A 04/20/2022   Procedure: DILATATION AND EVACUATION;  Surgeon:  Harold Hedge, MD;  Location: [facility];  Service: Gynecology;  Laterality: N/A;   LAPAROSCOPIC TUBAL LIGATION Bilateral 04/20/2022   Procedure: LAPAROSCOPIC BILATERAL TUBAL LIGATION WITH FILISHE CLIPS;  Surgeon: Harold Hedge, MD;  Location: [facility];  Service: Gynecology;  Laterality: Bilateral;   LEFT HEART CATHETERIZATION WITH CORONARY ANGIOGRAM N/A 12/15/2013   Procedure: LEFT HEART CATHETERIZATION WITH CORONARY ANGIOGRAM;  Surgeon: Lennette Bihari, MD;  Location: [facility] CATH LAB;  Service: Cardiovascular;  Laterality: N/A;   SHOULDER SURGERY Left 2008   TONSILLECTOMY     WISDOM TOOTH EXTRACTION     37yrs old    Current Outpatient Medications  Medication Sig Dispense Refill   aspirin 81 MG EC tablet Take 81 mg by mouth daily.       atomoxetine (STRATTERA) 60 MG capsule Take 1 capsule (60 mg total) by mouth daily. 90 capsule 0   loratadine (CLARITIN) 10 MG tablet Take 10 mg by mouth daily.     nitroGLYCERIN (NITROSTAT) 0.4 MG SL tablet Place 1 tablet (0.4 mg total) under the tongue every 5 (five) minutes as needed for chest pain. 25 tablet 3   simvastatin (ZOCOR) 5 MG tablet Take 1 tablet (5 mg total) by mouth every other day. NEED LAB WORK 45 tablet 3   No current facility-administered medications for this visit.    Allergies  Allergen Reactions   Nsaids Other (See Comments)  stent    Social History   Socioeconomic History   Marital status: Married    Spouse name: Not on file   Number of children: Not on file   Years of education: Not on file   Highest education level: Not on file  Occupational History   Occupation: radiology tech  Tobacco Use   Smoking status: Never   Smokeless tobacco: Never  Vaping Use   Vaping status: Never Used  Substance and Sexual Activity   Alcohol use: Not Currently    Alcohol/week: 0.0 standard drinks of alcohol    Comment: maybe once a month   Drug use: No   Sexual activity: Yes    Birth  control/protection: I.U.D.  Other Topics Concern   Not on file  Social History Narrative   Single   Lives with her boyfriend   No pregnancy   Social Determinants of Health   Financial Resource Strain: Not on file  Food Insecurity: Not on file  Transportation Needs: Not on file  Physical Activity: Not on file  Stress: Not on file  Social Connections: Not on file  Intimate Partner Violence: Not on file    Family History  Problem Relation Age of Onset   Thyroid disease Mother    Heart disease Paternal Grandfather    Colon cancer Paternal Grandmother     Review of Systems:  As stated in the HPI and otherwise negative.   BP 114/70   Pulse 74   Ht 5' 6.5" (1.689 m)   Wt 83 kg   SpO2 98%   BMI 29.09 kg/m   Physical Examination: General: Well developed, well nourished, NAD  HEENT: OP clear, mucus membranes moist  SKIN: warm, dry. No rashes. Neuro: No focal deficits  Musculoskeletal: Muscle strength 5/5 all ext  Psychiatric: Mood and affect normal  Neck: No JVD, no carotid bruits, no thyromegaly, no lymphadenopathy.  Lungs:Clear bilaterally, no wheezes, rhonci, crackles Cardiovascular: Regular rate and rhythm. No murmurs, gallops or rubs. Abdomen:Soft. Bowel sounds present. Non-tender.  Extremities: No lower extremity edema. Pulses are 2 + in the bilateral DP/PT.  EKG:  EKG is ordered today. The ekg ordered today demonstrates  EKG Interpretation Date/Time:  Friday May 21 2023 08:41:19 EDT Ventricular Rate:  74 PR Interval:  128 QRS Duration:  90 QT Interval:  370 QTC Calculation: 410 R Axis:   84  Text Interpretation: Normal sinus rhythm with sinus arrhythmia Normal ECG When compared with ECG of 15-Dec-2013 03:38, QT has shortened Confirmed by Verne Carrow [phone]) on 05/21/2023 8:46:00 AM    Recent Labs: 09/18/2022: ALT 19; BUN 14; Creatinine, Ser 0.72; Hemoglobin 14.8; Platelets 186; Potassium 5.2; Sodium 142   Lipid Panel    Component Value  Date/Time   CHOL 126 09/18/2022 1027   TRIG 59 09/18/2022 1027   HDL 52 09/18/2022 1027   CHOLHDL 2.4 09/18/2022 1027   CHOLHDL 2.0 08/01/2020 0821   VLDL 6 08/01/2020 0821   LDLCALC 61 09/18/2022 1027     Wt Readings from Last 3 Encounters:  05/21/23 83 kg  08/06/22 94.7 kg  07/21/22 96.9 kg    Assessment and Plan:   1. CAD without angina: No chest pain suggestive of angina. She is doing well. LDL 61 in December 2023. LFTs normal in December 2023. Continue ASA 81 mg daily and Zocor 5 mg every other day.    Labs/ tests ordered today include:   Orders Placed This Encounter  Procedures   EKG 12-Lead   Disposition:  F/U with me in one year.    Signed, Verne Carrow, MD, [facility] 05/21/2023 9:04 AM    [facility] [address] [place], [place], [place]  [address] Phone: [phone]; Fax: [phone]

## 2023-05-21 ENCOUNTER — Ambulatory Visit: Payer: Commercial Managed Care - PPO | Attending: Cardiovascular Disease | Admitting: Cardiovascular Disease

## 2023-05-21 ENCOUNTER — Other Ambulatory Visit (HOSPITAL_COMMUNITY): Payer: Self-pay

## 2023-05-21 ENCOUNTER — Encounter: Payer: Self-pay | Admitting: Cardiovascular Disease

## 2023-05-21 VITALS — BP 114/70 | HR 74 | Ht 66.5 in | Wt 183.0 lb

## 2023-05-21 DIAGNOSIS — I2510 Atherosclerotic heart disease of native coronary artery without angina pectoris: Secondary | ICD-10-CM

## 2023-05-21 MED ORDER — SIMVASTATIN 5 MG PO TABS
5.0000 mg | ORAL_TABLET | ORAL | 3 refills | Status: DC
  Filled 2023-05-21 – 2023-07-23 (×3): qty 45, 90d supply, fill #0
  Filled 2023-10-13: qty 45, 90d supply, fill #1
  Filled 2024-01-14: qty 45, 90d supply, fill #2
  Filled 2024-05-06: qty 45, 90d supply, fill #3

## 2023-05-21 NOTE — Patient Instructions (Addendum)
Medication Instructions:  Your physician recommends that you continue on your current medications as directed. Please refer to the Current Medication list given to you today.  *If you need a refill on your cardiac medications before your next appointment, please call your pharmacy*  Lab Work: None ordered today  Testing/Procedures: None ordered today  Follow-Up: At [facility], you and your health needs are our priority.  As part of our continuing mission to provide you with exceptional heart care, we have created designated Provider Care Teams.  These Care Teams include your primary Cardiologist (physician) and Advanced Practice Providers (APPs -  Physician Assistants and Nurse Practitioners) who all work together to provide you with the care you need, when you need it.  Your next appointment:   12 month(s)  The format for your next appointment:   In Person  Provider:   Lauree Chandler, MD

## 2023-07-08 ENCOUNTER — Other Ambulatory Visit (HOSPITAL_COMMUNITY): Payer: Self-pay

## 2023-07-21 ENCOUNTER — Other Ambulatory Visit (HOSPITAL_COMMUNITY): Payer: Self-pay

## 2023-07-23 ENCOUNTER — Other Ambulatory Visit: Payer: Self-pay | Admitting: Family Medicine

## 2023-07-23 ENCOUNTER — Other Ambulatory Visit (HOSPITAL_COMMUNITY): Payer: Self-pay

## 2023-07-25 ENCOUNTER — Other Ambulatory Visit (HOSPITAL_COMMUNITY): Payer: Self-pay

## 2023-07-25 MED ORDER — ATOMOXETINE HCL 60 MG PO CAPS
60.0000 mg | ORAL_CAPSULE | Freq: Every day | ORAL | 0 refills | Status: DC
Start: 2023-07-25 — End: 2023-10-26
  Filled 2023-07-25: qty 90, 90d supply, fill #0

## 2023-07-26 ENCOUNTER — Other Ambulatory Visit (HOSPITAL_COMMUNITY): Payer: Self-pay

## 2023-10-11 DIAGNOSIS — H524 Presbyopia: Secondary | ICD-10-CM | POA: Diagnosis not present

## 2023-10-13 ENCOUNTER — Other Ambulatory Visit (HOSPITAL_COMMUNITY): Payer: Self-pay

## 2023-10-13 ENCOUNTER — Other Ambulatory Visit: Payer: Self-pay | Admitting: Cardiovascular Disease

## 2023-10-14 ENCOUNTER — Other Ambulatory Visit (HOSPITAL_COMMUNITY): Payer: Self-pay

## 2023-10-14 MED ORDER — NITROGLYCERIN 0.4 MG SL SUBL
0.4000 mg | SUBLINGUAL_TABLET | SUBLINGUAL | 3 refills | Status: AC | PRN
  Filled 2023-10-14: qty 25, 8d supply, fill #0
  Filled 2024-09-29: qty 25, 8d supply, fill #1

## 2023-10-15 ENCOUNTER — Other Ambulatory Visit: Payer: Self-pay

## 2023-10-15 ENCOUNTER — Other Ambulatory Visit (HOSPITAL_COMMUNITY): Payer: Self-pay

## 2023-10-20 ENCOUNTER — Other Ambulatory Visit (HOSPITAL_COMMUNITY): Payer: Self-pay

## 2023-10-25 ENCOUNTER — Other Ambulatory Visit: Payer: Self-pay | Admitting: Family Medicine

## 2023-10-25 DIAGNOSIS — Z1231 Encounter for screening mammogram for malignant neoplasm of breast: Secondary | ICD-10-CM | POA: Diagnosis not present

## 2023-10-25 DIAGNOSIS — Z6828 Body mass index (BMI) 28.0-28.9, adult: Secondary | ICD-10-CM | POA: Diagnosis not present

## 2023-10-25 DIAGNOSIS — Z124 Encounter for screening for malignant neoplasm of cervix: Secondary | ICD-10-CM | POA: Diagnosis not present

## 2023-10-25 DIAGNOSIS — Z01419 Encounter for gynecological examination (general) (routine) without abnormal findings: Secondary | ICD-10-CM | POA: Diagnosis not present

## 2023-10-25 DIAGNOSIS — Z9889 Other specified postprocedural states: Secondary | ICD-10-CM | POA: Diagnosis not present

## 2023-10-25 NOTE — Telephone Encounter (Signed)
Copied from CRM [id number]. Topic: Clinical - Medication Refill >> Oct 25, 2023  2:24 PM Shelah Lewandowsky wrote: Most Recent Primary Care Visit:  Provider: Tommie Sams  Department: [facility]  Visit Type: PHYSICAL  Date: 08/06/2022  Medication: atomoxetine (STRATTERA) 60 MG capsule  Has the patient contacted their pharmacy?  (Agent: If no, request that the patient contact the pharmacy for the refill. If patient does not wish to contact the pharmacy document the reason why and proceed with request.) (Agent: If yes, when and what did the pharmacy advise?)  Is this the correct pharmacy for this prescription?  If no, delete pharmacy and type the correct one.  This is the patient's preferred pharmacy:  [facility] [address] [place] [address] Phone: [phone] Fax: [phone]    Has the prescription been filled recently? Yes  Is the patient out of the medication? No  Has the patient been seen for an appointment in the last year OR does the patient have an upcoming appointment? Yes  Can we respond through MyChart? Yes  Agent: Please be advised that Rx refills may take up to 3 business days. We ask that you follow-up with your pharmacy.

## 2023-10-25 NOTE — Telephone Encounter (Unsigned)
Copied from CRM [id number]. Topic: Clinical - Medication Refill >> Oct 25, 2023  2:24 PM Shelah Lewandowsky wrote: Most Recent Primary Care Visit:  Provider: Tommie Sams  Department: [facility]  Visit Type: PHYSICAL  Date: 08/06/2022  Medication: atomoxetine (STRATTERA) 60 MG capsule  Has the patient contacted their pharmacy? Yes (Agent: If no, request that the patient contact the pharmacy for the refill. If patient does not wish to contact the pharmacy document the reason why and proceed with request.) (Agent: If yes, when and what did the pharmacy advise?)  Is this the correct pharmacy for this prescription? Yes If no, delete pharmacy and type the correct one.  This is the patient's preferred pharmacy:  [facility] [address] [place] [address] Phone: [phone] Fax: [phone]    Has the prescription been filled recently? Yes  Is the patient out of the medication? No  Has the patient been seen for an appointment in the last year OR does the patient have an upcoming appointment? Yes  Can we respond through MyChart? Yes  Agent: Please be advised that Rx refills may take up to 3 business days. We ask that you follow-up with your pharmacy.

## 2023-10-26 ENCOUNTER — Other Ambulatory Visit (HOSPITAL_COMMUNITY): Payer: Self-pay

## 2023-10-26 ENCOUNTER — Other Ambulatory Visit: Payer: Self-pay | Admitting: Family Medicine

## 2023-10-26 MED ORDER — ATOMOXETINE HCL 60 MG PO CAPS
60.0000 mg | ORAL_CAPSULE | Freq: Every day | ORAL | 0 refills | Status: DC
  Filled 2023-10-26: qty 30, 30d supply, fill #0

## 2023-11-12 ENCOUNTER — Ambulatory Visit (INDEPENDENT_AMBULATORY_CARE_PROVIDER_SITE_OTHER): Payer: Commercial Managed Care - PPO | Admitting: Family Medicine

## 2023-11-12 ENCOUNTER — Other Ambulatory Visit (HOSPITAL_COMMUNITY): Payer: Self-pay

## 2023-11-12 ENCOUNTER — Encounter: Payer: Self-pay | Admitting: Family Medicine

## 2023-11-12 VITALS — BP 114/70 | HR 69 | Temp 97.7°F | Ht 66.5 in | Wt 182.0 lb

## 2023-11-12 DIAGNOSIS — Z0000 Encounter for general adult medical examination without abnormal findings: Secondary | ICD-10-CM

## 2023-11-12 DIAGNOSIS — Z130 Encounter for screening for diseases of the blood and blood-forming organs and certain disorders involving the immune mechanism: Secondary | ICD-10-CM

## 2023-11-12 DIAGNOSIS — Z0001 Encounter for general adult medical examination with abnormal findings: Secondary | ICD-10-CM

## 2023-11-12 DIAGNOSIS — I2510 Atherosclerotic heart disease of native coronary artery without angina pectoris: Secondary | ICD-10-CM

## 2023-11-12 MED ORDER — ATOMOXETINE HCL 60 MG PO CAPS
60.0000 mg | ORAL_CAPSULE | Freq: Every day | ORAL | 3 refills | Status: DC
  Filled 2023-11-12 – 2023-11-24 (×2): qty 90, 90d supply, fill #0
  Filled 2024-02-23: qty 90, 90d supply, fill #1
  Filled 2024-05-30: qty 90, 90d supply, fill #2

## 2023-11-12 NOTE — Progress Notes (Signed)
 Subjective:  Patient ID: Greig Right, female    DOB: [birth year]  Age: 38 y.o. MRN: [id number]  CC:   Chief Complaint  Patient presents with   Annual Exam    HPI:  38 year old presents for annual exam.  Patient states that she is doing well.  Denies chest pain or shortness of breath.  Recently got her Pap smear earlier this month.  Reports that it was normal.  [facility] In regards to the remainder of her preventative health care, she is a candidate for hepatitis C screening.  Declines today.  Patient needs her routine labs.  Needs refill on Strattera.  Patient Active Problem List   Diagnosis Date Noted   Annual physical exam 08/07/2022   ADD (attention deficit disorder) 07/21/2022   Coronary artery disease involving native coronary artery of native heart without angina pectoris 04/16/2009    Social Hx   Social History   Socioeconomic History   Marital status: Married    Spouse name: Not on file   Number of children: Not on file   Years of education: Not on file   Highest education level: Not on file  Occupational History   Occupation: radiology tech  Tobacco Use   Smoking status: Never   Smokeless tobacco: Never  Vaping Use   Vaping status: Never Used  Substance and Sexual Activity   Alcohol use: Not Currently    Alcohol/week: 0.0 standard drinks of alcohol    Comment: maybe once a month   Drug use: No   Sexual activity: Yes    Birth control/protection: I.U.D.  Other Topics Concern   Not on file  Social History Narrative   Single   Lives with her boyfriend   No pregnancy   Social Drivers of Corporate investment banker Strain: Not on file  Food Insecurity: Not on file  Transportation Needs: Not on file  Physical Activity: Not on file  Stress: Not on file  Social Connections: Not on file    Review of Systems Per HPI  Objective:  BP 114/70   Pulse 69   Temp 97.7 F (36.5 C)   Ht 5' 6.5" (1.689 m)   Wt 182 lb (82.6  kg)   SpO2 99%   BMI 28.94 kg/m      11/12/2023    8:20 AM 05/21/2023    8:39 AM 08/06/2022    1:24 PM  BP/Weight  Systolic BP 114 114 107  Diastolic BP 70 70 68  Wt. (Lbs) 182 183 208.8  BMI 28.94 kg/m2 29.09 kg/m2 33.2 kg/m2    Physical Exam Vitals and nursing note reviewed.  Constitutional:      General: She is not in acute distress.    Appearance: Normal appearance.  HENT:     Head: Normocephalic and atraumatic.  Eyes:     General:        Right eye: No discharge.        Left eye: No discharge.     Conjunctiva/sclera: Conjunctivae normal.  Cardiovascular:     Rate and Rhythm: Normal rate and regular rhythm.  Pulmonary:     Effort: Pulmonary effort is normal.     Breath sounds: Normal breath sounds. No wheezing, rhonchi or rales.  Neurological:     Mental Status: She is alert.  Psychiatric:        Mood and Affect: Mood normal.        Behavior: Behavior normal.     Lab  Results  Component Value Date   WBC 4.5 09/18/2022   HGB 14.8 09/18/2022   HCT 44.8 09/18/2022   PLT 186 09/18/2022   GLUCOSE 99 09/18/2022   CHOL 126 09/18/2022   TRIG 59 09/18/2022   HDL 52 09/18/2022   LDLCALC 61 09/18/2022   ALT 19 09/18/2022   AST 18 09/18/2022   NA 142 09/18/2022   K 5.2 09/18/2022   CL 103 09/18/2022   CREATININE 0.72 09/18/2022   BUN 14 09/18/2022   CO2 24 09/18/2022   TSH 3.760 03/21/2019   INR 1.04 12/15/2013     Assessment & Plan:  Annual physical exam Assessment & Plan: Doing well.  Health maintenance updated.  Labs ordered.  Strattera refilled.   Coronary artery disease involving native coronary artery of native heart without angina pectoris -     CMP14+EGFR -     Lipid panel  Screening for deficiency anemia -     CBC  Other orders -     Atomoxetine HCl; Take 1 capsule (60 mg total) by mouth daily.  Dispense: 90 capsule; Refill: 3   Follow-up:  Annually  Everlene Other DO [facility]

## 2023-11-12 NOTE — Patient Instructions (Signed)
 Labs whenever you like.  Follow up annually.  Take care  Dr. Adriana Simas

## 2023-11-12 NOTE — Assessment & Plan Note (Signed)
 Doing well.  Health maintenance updated.  Labs ordered.  Strattera refilled.

## 2023-11-13 LAB — LIPID PANEL
Chol/HDL Ratio: 2 ratio (ref 0.0–4.4)
Cholesterol, Total: 127 mg/dL (ref 100–199)
HDL: 65 mg/dL (ref >39)
LDL Chol Calc (NIH): 52 mg/dL (ref 0–99)
Triglycerides: 40 mg/dL (ref 0–149)
VLDL Cholesterol Cal: 10 mg/dL (ref 5–40)

## 2023-11-13 LAB — CMP14+EGFR
ALT: 19 IU/L (ref 0–32)
AST: 17 IU/L (ref 0–40)
Albumin: 4.7 g/dL (ref 3.9–4.9)
Alkaline Phosphatase: 53 IU/L (ref 44–121)
BUN/Creatinine Ratio: 26 — ABNORMAL HIGH (ref 9–23)
BUN: 16 mg/dL (ref 6–20)
Bilirubin Total: 0.5 mg/dL (ref 0.0–1.2)
CO2: 23 mmol/L (ref 20–29)
Calcium: 9.3 mg/dL (ref 8.7–10.2)
Chloride: 104 mmol/L (ref 96–106)
Creatinine, Ser: 0.62 mg/dL (ref 0.57–1.00)
Globulin, Total: 2.2 g/dL (ref 1.5–4.5)
Glucose: 91 mg/dL (ref 70–99)
Potassium: 5.2 mmol/L (ref 3.5–5.2)
Sodium: 142 mmol/L (ref 134–144)
Total Protein: 6.9 g/dL (ref 6.0–8.5)
eGFR: 118 mL/min/{1.73_m2} (ref >59)

## 2023-11-13 LAB — CBC
Hematocrit: 43.6 % (ref 34.0–46.6)
Hemoglobin: 14.4 g/dL (ref 11.1–15.9)
MCH: 32.6 pg (ref 26.6–33.0)
MCHC: 33 g/dL (ref 31.5–35.7)
MCV: 99 fL — ABNORMAL HIGH (ref 79–97)
Platelets: 190 10*3/uL (ref 150–450)
RBC: 4.42 x10E6/uL (ref 3.77–5.28)
RDW: 12.4 % (ref 11.7–15.4)
WBC: 4.5 10*3/uL (ref 3.4–10.8)

## 2023-11-15 ENCOUNTER — Telehealth: Payer: Self-pay | Admitting: Family Medicine

## 2023-11-15 NOTE — Telephone Encounter (Signed)
 Pt given lab results per notes of Everlene Other, DO from 11/14/23 on 11/15/23. Pt verbalized understanding. Copied from CRM [id number]. Topic: Clinical - Lab/Test Results >> Nov 15, 2023  2:46 PM Franchot Heidelberg wrote: Reason for CRM:    Erin Morris, [facility] 11/15/2023  2:16 PM EST Back to Top  Called pt to go over results, no answer

## 2023-11-24 ENCOUNTER — Other Ambulatory Visit (HOSPITAL_COMMUNITY): Payer: Self-pay

## 2024-03-03 ENCOUNTER — Encounter: Payer: Self-pay | Admitting: Family Medicine

## 2024-05-30 ENCOUNTER — Other Ambulatory Visit (HOSPITAL_COMMUNITY): Payer: Self-pay

## 2024-08-07 ENCOUNTER — Other Ambulatory Visit: Payer: Self-pay | Admitting: Family Medicine

## 2024-08-07 ENCOUNTER — Encounter: Payer: Self-pay | Admitting: Family Medicine

## 2024-08-07 ENCOUNTER — Other Ambulatory Visit (HOSPITAL_COMMUNITY): Payer: Self-pay

## 2024-08-07 MED ORDER — ATOMOXETINE HCL 80 MG PO CAPS
80.0000 mg | ORAL_CAPSULE | Freq: Every day | ORAL | 1 refills | Status: AC
  Filled 2024-08-07: qty 90, 90d supply, fill #0

## 2024-09-29 ENCOUNTER — Other Ambulatory Visit: Payer: Self-pay | Admitting: Cardiovascular Disease

## 2024-09-29 ENCOUNTER — Other Ambulatory Visit: Payer: Self-pay

## 2024-09-29 MED ORDER — SIMVASTATIN 5 MG PO TABS
5.0000 mg | ORAL_TABLET | ORAL | 0 refills | Status: AC
  Filled 2024-09-29: qty 15, 30d supply, fill #0

## 2024-10-02 ENCOUNTER — Encounter: Payer: Self-pay | Admitting: Pharmacist

## 2024-10-02 ENCOUNTER — Other Ambulatory Visit: Payer: Self-pay

## 2024-10-05 ENCOUNTER — Other Ambulatory Visit: Payer: Self-pay

## 2024-11-16 ENCOUNTER — Encounter: Admitting: Family Medicine
